# Patient Record
Sex: Female | Born: 1935 | Race: White | Hispanic: No | Smoking: Former smoker
Health system: Southern US, Community
[De-identification: ages and names within clinical notes are randomized; demographics above are authoritative.]

## PROBLEM LIST (undated history)

## (undated) DIAGNOSIS — I482 Chronic atrial fibrillation, unspecified: Secondary | ICD-10-CM

## (undated) DIAGNOSIS — I341 Nonrheumatic mitral (valve) prolapse: Secondary | ICD-10-CM

## (undated) DIAGNOSIS — I10 Essential (primary) hypertension: Secondary | ICD-10-CM

## (undated) DIAGNOSIS — E119 Type 2 diabetes mellitus without complications: Secondary | ICD-10-CM

## (undated) DIAGNOSIS — N390 Urinary tract infection, site not specified: Secondary | ICD-10-CM

## (undated) DIAGNOSIS — I5032 Chronic diastolic (congestive) heart failure: Secondary | ICD-10-CM

## (undated) DIAGNOSIS — M4800 Spinal stenosis, site unspecified: Secondary | ICD-10-CM

## (undated) DIAGNOSIS — M810 Age-related osteoporosis without current pathological fracture: Secondary | ICD-10-CM

---

## 2016-02-23 ENCOUNTER — Encounter: Payer: Medicare Other | Attending: Surgery | Admitting: Surgery

## 2016-02-23 DIAGNOSIS — E11622 Type 2 diabetes mellitus with other skin ulcer: Secondary | ICD-10-CM | POA: Insufficient documentation

## 2016-02-23 DIAGNOSIS — F0390 Unspecified dementia without behavioral disturbance: Secondary | ICD-10-CM | POA: Diagnosis not present

## 2016-02-23 DIAGNOSIS — X58XXXA Exposure to other specified factors, initial encounter: Secondary | ICD-10-CM | POA: Insufficient documentation

## 2016-02-23 DIAGNOSIS — I482 Chronic atrial fibrillation: Secondary | ICD-10-CM | POA: Diagnosis not present

## 2016-02-23 DIAGNOSIS — I10 Essential (primary) hypertension: Secondary | ICD-10-CM | POA: Insufficient documentation

## 2016-02-23 DIAGNOSIS — L97212 Non-pressure chronic ulcer of right calf with fat layer exposed: Secondary | ICD-10-CM | POA: Insufficient documentation

## 2016-02-23 DIAGNOSIS — I739 Peripheral vascular disease, unspecified: Secondary | ICD-10-CM | POA: Insufficient documentation

## 2016-02-23 DIAGNOSIS — I890 Lymphedema, not elsewhere classified: Secondary | ICD-10-CM | POA: Insufficient documentation

## 2016-02-23 DIAGNOSIS — S81811A Laceration without foreign body, right lower leg, initial encounter: Secondary | ICD-10-CM | POA: Insufficient documentation

## 2016-02-23 DIAGNOSIS — E785 Hyperlipidemia, unspecified: Secondary | ICD-10-CM | POA: Diagnosis not present

## 2016-02-23 NOTE — Progress Notes (Addendum)
Terri Terri Barker, Terri Terri Barker ([id number]) Visit Report for 02/23/2016 Chief Complaint Document Details Patient Name: Terri Barker Date of Service: 02/23/2016 9:00 AM Medical Record Number: [id number] Patient Account Number: [id number] Date of Birth/Sex: 1927/09/21 (83 y.o. Female) Treating RN: Huel Coventry Primary Care Provider: Clovis Pu, JILL Other Clinician: Referring Provider: Eloisa Northern Treating Provider/Extender: Rudene Re in Treatment: 0 Information Obtained from: Patient Chief Complaint Patient seen for complaints of Non-Healing Wound to the right lower extremity caused by multiple falls due to being unsteady. These have been there over the last month Electronic Signature(s) Signed: 02/23/2016 10:56:32 AM By: Evlyn Kanner MD, FACS Entered By: Evlyn Kanner on 02/23/2016 10:56:32 Terri Terri Barker ([id number]) -------------------------------------------------------------------------------- HPI Details Patient Name: Terri Terri Barker Date of Service: 02/23/2016 9:00 AM Medical Record Number: [id number] Patient Account Number: [id number] Date of Birth/Sex: 1927/01/13 (83 y.o. Female) Treating RN: Huel Coventry Primary Care Provider: Clovis Pu, JILL Other Clinician: Referring Provider: Eloisa Northern Treating Provider/Extender: Rudene Re in Treatment: 0 History of Present Illness Location: right lower extremity mainly on the posterior and lateral calf Quality: Patient reports experiencing a dull pain to affected area(s). Severity: Patient states wound are getting worse. Duration: Patient has had the wound for < 4 weeks prior to presenting for treatment Timing: Pain in wound is Intermittent (comes and goes Context: The wound appeared gradually over time Modifying Factors: Other treatment(s) tried include:has been on local treatment with Santyl ointment and also oral doxycycline Associated Signs and Symptoms: Patient reports having increase swelling. HPI Description: 83 year old patient who is in  to [place] by the [facility], has a past medical history of generalized weakness, chronic atrial fibrillation, type 2 diabetes mellitus without complications, hyperlipidemia, benign neoplasm of bone, essential hypertension. She has been under the care of [facility] for a wound on her left shin since the middle of January 2018. the recommendation was to use triple antibiotic ointment and Telfa daily. 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 At a later visit the patient was also found to have a wound on her right shin of about 3 weeks' duration. The wound was treated with Santyl ointment. Right lower extremity DVT study was negative. Non-invasive arterial study done showed there was hemodynamically significant arterial sclerotic changes involving the right common femoral artery, proximal right superficial femoral artery, right posterior tibial artery and the right DPA. There was also hemodynamically significant arterial sclerotic disease involving the left popliteal artery. An attempt was made to review medical records electronically but none were available on the hospital system. Her most recent INR was 3.1 and her last hemoglobin A1c was 6.5 Electronic Signature(s) Signed: 02/23/2016 10:58:05 AM By: Evlyn Kanner MD, FACS Previous Signature: 02/23/2016 9:55:44 AM Version By: Evlyn Kanner MD, FACS Previous Signature: 02/23/2016 9:39:56 AM Version By: Evlyn Kanner MD, FACS Previous Signature: 02/23/2016 9:37:07 AM Version By: Evlyn Kanner MD, FACS Previous Signature: 02/23/2016 9:36:20 AM Version By: Evlyn Kanner MD, FACS Entered By: Evlyn Kanner on 02/23/2016 10:58:04 Terri Terri Barker ([id number]) -------------------------------------------------------------------------------- Physical Exam Details Patient Name: Terri Terri Barker Date of Service: 02/23/2016 9:00 AM Medical Record Number:  [id number] Patient Account Number: [id number] Date of Birth/Sex: Sep 26, 1927 (83 y.o. Female) Treating RN: Huel Coventry Primary Care Provider: Clovis Pu, JILL Other Clinician: Referring Provider: Eloisa Northern Treating Provider/Extender: Rudene Re in Treatment: 0 Constitutional . Pulse regular. Respirations normal and unlabored. Afebrile. . Eyes Nonicteric. Reactive to light. Ears, Nose, Mouth, and  Throat Lips, teeth, and gums WNL.Marland Kitchen Moist mucosa without lesions. Neck supple and nontender. No palpable supraclavicular or cervical adenopathy. Normal sized without goiter. Respiratory WNL. No retractions.. Cardiovascular Pedal Pulses WNL. recent arterial duplex study evaluation done and reviewed. she has got lymphedema both lower extremity stage I. Gastrointestinal (GI) Abdomen without masses or tenderness.. No liver or spleen enlargement or tenderness.. Lymphatic No adneopathy. No adenopathy. No adenopathy. Musculoskeletal Adexa without tenderness or enlargement.. Digits and nails w/o clubbing, cyanosis, infection, petechiae, ischemia, or inflammatory conditions.. Integumentary (Hair, Skin) No suspicious lesions. No crepitus or fluctuance. No peri-wound warmth or erythema. No masses.Marland Kitchen Psychiatric Judgement and insight Intact.. No evidence of depression, anxiety, or agitation.. Notes the patient has healed scars on the left lower extremity with multiple bruises bilaterally and being on Coumadin her skin has ecchymosis all over. She has several lacerated areas on the right lower extremity some of them covered with slough and due to high INR no debridement has been done today Electronic Signature(s) Signed: 02/23/2016 10:59:21 AM By: Evlyn Kanner MD, FACS Entered By: Evlyn Kanner on 02/23/2016 10:59:20 Terri Terri Barker ([id number]) -------------------------------------------------------------------------------- Physician Orders Details Patient Name: Terri Terri Barker Date of Service:  02/23/2016 9:00 AM Medical Record Number: [id number] Patient Account Number: [id number] Date of Birth/Sex: 1927/01/16 (83 y.o. Female) Treating RN: Huel Coventry Primary Care Provider: Clovis Pu, JILL Other Clinician: Referring Provider: Eloisa Northern Treating Provider/Extender: Rudene Re in Treatment: 0 Verbal / Phone Orders: No Diagnosis Coding Wound Cleansing Wound #1 Right,Anterior Lower Leg o Clean wound with Normal Saline. Wound #2 Right,Lateral Lower Leg o Clean wound with Normal Saline. Wound #3 Right,Medial Lower Leg o Clean wound with Normal Saline. Wound #4 Right,Posterior Lower Leg o Clean wound with Normal Saline. Wound #5 Left Lower Leg o Clean wound with Normal Saline. Wound #6 Right Lower Leg o Clean wound with Normal Saline. Anesthetic Wound #1 Right,Anterior Lower Leg o Topical Lidocaine 4% cream applied to wound bed prior to debridement Wound #2 Right,Lateral Lower Leg o Topical Lidocaine 4% cream applied to wound bed prior to debridement Wound #3 Right,Medial Lower Leg o Topical Lidocaine 4% cream applied to wound bed prior to debridement Wound #4 Right,Posterior Lower Leg o Topical Lidocaine 4% cream applied to wound bed prior to debridement Wound #5 Left Lower Leg o Topical Lidocaine 4% cream applied to wound bed prior to debridement Wound #6 Right Lower Leg o Topical Lidocaine 4% cream applied to wound bed prior to debridement Terri Terri Barker, Terri Terri Barker ([id number]) Primary Wound Dressing Wound #1 Right,Anterior Lower Leg o Santyl Ointment - Medihoney or Manuka Honey (over the counter) Wound #2 Right,Lateral Lower Leg o Santyl Ointment - Medihoney or Manuka Honey (over the counter) Wound #3 Right,Medial Lower Leg o Santyl Ointment - Medihoney or Manuka Honey (over the counter) Wound #4 Right,Posterior Lower Leg o Santyl Ointment - Medihoney or Manuka Honey (over the counter) Wound #5 Left Lower Leg o Santyl Ointment - Medihoney  or Manuka Honey (over the counter) Wound #6 Right Lower Leg o Santyl Ointment - Medihoney or Manuka Honey (over the counter) Secondary Dressing Wound #1 Right,Anterior Lower Leg o ABD and Kerlix/Conform Wound #2 Right,Lateral Lower Leg o ABD and Kerlix/Conform Wound #3 Right,Medial Lower Leg o ABD and Kerlix/Conform Wound #4 Right,Posterior Lower Leg o ABD and Kerlix/Conform Wound #5 Left Lower Leg o ABD and Kerlix/Conform Wound #6 Right Lower Leg o ABD and Kerlix/Conform Dressing Change Frequency Wound #1 Right,Anterior Lower Leg o Change dressing every day. Wound #2 Right,Lateral Lower Leg o  Change dressing every day. Wound #3 Right,Medial Lower Leg o Change dressing every day. Terri Barker, Terri Terri Barker ([id number]) Wound #4 Right,Posterior Lower Leg o Change dressing every day. Wound #5 Left Lower Leg o Change dressing every day. Wound #6 Right Lower Leg o Change dressing every day. Follow-up Appointments Wound #1 Right,Anterior Lower Leg o Return Appointment in 1 week. Wound #2 Right,Lateral Lower Leg o Return Appointment in 1 week. Wound #3 Right,Medial Lower Leg o Return Appointment in 1 week. Wound #4 Right,Posterior Lower Leg o Return Appointment in 1 week. Wound #5 Left Lower Leg o Return Appointment in 1 week. Wound #6 Right Lower Leg o Return Appointment in 1 week. Off-Loading Wound #1 Right,Anterior Lower Leg o Other: - Elevate legs as much as possible. Wound #2 Right,Lateral Lower Leg o Other: - Elevate legs as much as possible. Wound #3 Right,Medial Lower Leg o Other: - Elevate legs as much as possible. Wound #4 Right,Posterior Lower Leg o Other: - Elevate legs as much as possible. Wound #5 Left Lower Leg o Other: - Elevate legs as much as possible. Wound #6 Right Lower Leg o Other: - Elevate legs as much as possible. Terri Barker, Terri Terri Barker ([id number]) Additional Orders / Instructions Wound #1 Right,Anterior Lower  Leg o Increase protein intake. o Other: - Vitamin A, C and Zinc Wound #2 Right,Lateral Lower Leg o Increase protein intake. o Other: - Vitamin A, C and Zinc Wound #3 Right,Medial Lower Leg o Increase protein intake. o Other: - Vitamin A, C and Zinc Wound #4 Right,Posterior Lower Leg o Increase protein intake. o Other: - Vitamin A, C and Zinc Wound #5 Left Lower Leg o Increase protein intake. o Other: - Vitamin A, C and Zinc Wound #6 Right Lower Leg o Increase protein intake. o Other: - Vitamin A, C and Zinc Home Health Wound #1 Right,Anterior Lower Leg o Continue Home Health Visits o Home Health Nurse may visit PRN to address patientos wound care needs. o FACE TO FACE ENCOUNTER: MEDICARE and MEDICAID PATIENTS: I certify that this patient is under my care and that I had a face-to-face encounter that meets the physician face-to-face encounter requirements with this patient on this date. The encounter with the patient was in whole or in part for the following MEDICAL CONDITION: (primary reason for Home Healthcare) MEDICAL NECESSITY: I certify, that based on my findings, NURSING services are a medically necessary home health service. HOME BOUND STATUS: I certify that my clinical findings support that this patient is homebound (i.e., Due to illness or injury, pt requires aid of supportive devices such as crutches, cane, wheelchairs, walkers, the use of special transportation or the assistance of another person to leave their place of residence. There is a normal inability to leave the home and doing so requires considerable and taxing effort. Other absences are for medical reasons / religious services and are infrequent or of short duration when for other reasons). o If current dressing causes regression in wound condition, may D/C ordered dressing product/s and apply Normal Saline Moist Dressing daily until next Wound Healing Center / Other  MD appointment. Notify Wound Healing Center of regression in wound condition at [phone]. o Please direct any NON-WOUND related issues/requests for orders to patient's Primary Care Physician Wound #2 Right,Lateral Lower Leg o Continue Home Health Visits Terri Terri Barker, Terri Barker ([id number]) o Home Health Nurse may visit PRN to address patientos wound care needs. o FACE TO FACE ENCOUNTER: MEDICARE and MEDICAID PATIENTS: I certify that this patient is under my care and  that I had a face-to-face encounter that meets the physician face-to-face encounter requirements with this patient on this date. The encounter with the patient was in whole or in part for the following MEDICAL CONDITION: (primary reason for Home Healthcare) MEDICAL NECESSITY: I certify, that based on my findings, NURSING services are a medically necessary home health service. HOME BOUND STATUS: I certify that my clinical findings support that this patient is homebound (i.e., Due to illness or injury, pt requires aid of supportive devices such as crutches, cane, wheelchairs, walkers, the use of special transportation or the assistance of another person to leave their place of residence. There is a normal inability to leave the home and doing so requires considerable and taxing effort. Other absences are for medical reasons / religious services and are infrequent or of short duration when for other reasons). o If current dressing causes regression in wound condition, may D/C ordered dressing product/s and apply Normal Saline Moist Dressing daily until next Wound Healing Center / Other MD appointment. Notify Wound Healing Center of regression in wound condition at [phone]. o Please direct any NON-WOUND related issues/requests for orders to patient's Primary Care Physician Wound #3 Right,Medial Lower Leg o Continue Home Health Visits o Home Health Nurse may visit PRN to address patientos wound care needs. o FACE TO  FACE ENCOUNTER: MEDICARE and MEDICAID PATIENTS: I certify that this patient is under my care and that I had a face-to-face encounter that meets the physician face-to-face encounter requirements with this patient on this date. The encounter with the patient was in whole or in part for the following MEDICAL CONDITION: (primary reason for Home Healthcare) MEDICAL NECESSITY: I certify, that based on my findings, NURSING services are a medically necessary home health service. HOME BOUND STATUS: I certify that my clinical findings support that this patient is homebound (i.e., Due to illness or injury, pt requires aid of supportive devices such as crutches, cane, wheelchairs, walkers, the use of special transportation or the assistance of another person to leave their place of residence. There is a normal inability to leave the home and doing so requires considerable and taxing effort. Other absences are for medical reasons / religious services and are infrequent or of short duration when for other reasons). o If current dressing causes regression in wound condition, may D/C ordered dressing product/s and apply Normal Saline Moist Dressing daily until next Wound Healing Center / Other MD appointment. Notify Wound Healing Center of regression in wound condition at [phone]. o Please direct any NON-WOUND related issues/requests for orders to patient's Primary Care Physician Wound #4 Right,Posterior Lower Leg o Continue Home Health Visits o Home Health Nurse may visit PRN to address patientos wound care needs. o FACE TO FACE ENCOUNTER: MEDICARE and MEDICAID PATIENTS: I certify that this patient is under my care and that I had a face-to-face encounter that meets the physician face-to-face encounter requirements with this patient on this date. The encounter with the patient was in whole or in part for the following MEDICAL CONDITION: (primary reason for Home Healthcare) MEDICAL NECESSITY: I  certify, that based on my findings, NURSING services are a medically necessary home health service. HOME BOUND STATUS: I certify that my clinical findings support that this patient is homebound (i.e., Due to illness or injury, pt requires aid of Terri Barker, Terri Barker ([id number]) supportive devices such as crutches, cane, wheelchairs, walkers, the use of special transportation or the assistance of another person to leave their place of residence. There is a  normal inability to leave the home and doing so requires considerable and taxing effort. Other absences are for medical reasons / religious services and are infrequent or of short duration when for other reasons). o If current dressing causes regression in wound condition, may D/C ordered dressing product/s and apply Normal Saline Moist Dressing daily until next Wound Healing Center / Other MD appointment. Notify Wound Healing Center of regression in wound condition at [phone]. o Please direct any NON-WOUND related issues/requests for orders to patient's Primary Care Physician Wound #5 Left Lower Leg o Continue Home Health Visits o Home Health Nurse may visit PRN to address patientos wound care needs. o FACE TO FACE ENCOUNTER: MEDICARE and MEDICAID PATIENTS: I certify that this patient is under my care and that I had a face-to-face encounter that meets the physician face-to-face encounter requirements with this patient on this date. The encounter with the patient was in whole or in part for the following MEDICAL CONDITION: (primary reason for Home Healthcare) MEDICAL NECESSITY: I certify, that based on my findings, NURSING services are a medically necessary home health service. HOME BOUND STATUS: I certify that my clinical findings support that this patient is homebound (i.e., Due to illness or injury, pt requires aid of supportive devices such as crutches, cane, wheelchairs, walkers, the use of special transportation or the  assistance of another person to leave their place of residence. There is a normal inability to leave the home and doing so requires considerable and taxing effort. Other absences are for medical reasons / religious services and are infrequent or of short duration when for other reasons). o If current dressing causes regression in wound condition, may D/C ordered dressing product/s and apply Normal Saline Moist Dressing daily until next Wound Healing Center / Other MD appointment. Notify Wound Healing Center of regression in wound condition at [phone]. o Please direct any NON-WOUND related issues/requests for orders to patient's Primary Care Physician Wound #6 Right Lower Leg o Continue Home Health Visits o Home Health Nurse may visit PRN to address patientos wound care needs. o FACE TO FACE ENCOUNTER: MEDICARE and MEDICAID PATIENTS: I certify that this patient is under my care and that I had a face-to-face encounter that meets the physician face-to-face encounter requirements with this patient on this date. The encounter with the patient was in whole or in part for the following MEDICAL CONDITION: (primary reason for Home Healthcare) MEDICAL NECESSITY: I certify, that based on my findings, NURSING services are a medically necessary home health service. HOME BOUND STATUS: I certify that my clinical findings support that this patient is homebound (i.e., Due to illness or injury, pt requires aid of supportive devices such as crutches, cane, wheelchairs, walkers, the use of special transportation or the assistance of another person to leave their place of residence. There is a normal inability to leave the home and doing so requires considerable and taxing effort. Other absences are for medical reasons / religious services and are infrequent or of short duration when for other reasons). o If current dressing causes regression in wound condition, may D/C ordered dressing  product/s and apply Normal Saline Moist Dressing daily until next Wound Healing Center / Other MD appointment. Notify Wound Healing Center of regression in wound condition at [phone]. Terri Barker, Terri Terri Barker ([id number]) o Please direct any NON-WOUND related issues/requests for orders to patient's Primary Care Physician Medications-please add to medication list. Wound #1 Right,Anterior Lower Leg o Santyl Enzymatic Ointment - apply nickel thick to patient's wounds Wound #  2 Right,Lateral Lower Leg o Santyl Enzymatic Ointment - apply nickel thick to patient's wounds Wound #3 Right,Medial Lower Leg o Santyl Enzymatic Ointment - apply nickel thick to patient's wounds Wound #4 Right,Posterior Lower Leg o Santyl Enzymatic Ointment - apply nickel thick to patient's wounds Wound #5 Left Lower Leg o Santyl Enzymatic Ointment - apply nickel thick to patient's wounds Wound #6 Right Lower Leg o Santyl Enzymatic Ointment - apply nickel thick to patient's wounds Consults o Vascular - Arterial Studies and Cardiac Consult Patient Medications Allergies: codeine, Feldene, Sulfa (Sulfonamide Antibiotics) Notifications Medication Indication Start End Santyl 02/23/2016 DOSE topical 250 unit/gram ointment - ointment topical as directed Electronic Signature(s) Signed: 02/23/2016 11:00:47 AM By: Evlyn KannerBritto, Dorethy Tomey MD, FACS Entered By: Evlyn KannerBritto, Lenin Kuhnle on 02/23/2016 11:00:43 Terri RouteBARKER, Lyle ([id number]) -------------------------------------------------------------------------------- Problem List Details Patient Name: Terri RouteBARKER, Tauna Date of Service: 02/23/2016 9:00 AM Medical Record Number: [id number] Patient Account Number: [id number] Date of Birth/Sex: 27-Nov-1927 (83 y.o. Female) Treating RN: Huel CoventryWoody, Kim Primary Care Provider: Clovis PuVAN HORN, JILL Other Clinician: Referring Provider: Eloisa NorthernAMIN, SAAD Treating Provider/Extender: Rudene ReBritto, Dayanna Pryce Weeks in Treatment: 0 Active Problems ICD-10 Encounter Code Description Active  Date Diagnosis E11.622 Type 2 diabetes mellitus with other skin ulcer 02/23/2016 Yes S81.811A Laceration without foreign body, right lower leg, initial 02/23/2016 Yes encounter L97.212 Non-pressure chronic ulcer of right calf with fat layer 02/23/2016 Yes exposed I89.0 Lymphedema, not elsewhere classified 02/23/2016 Yes I73.9 Peripheral vascular disease, unspecified 02/23/2016 Yes Inactive Problems Resolved Problems Electronic Signature(s) Signed: 02/23/2016 11:02:22 AM By: Evlyn KannerBritto, Eilis Chestnutt MD, FACS Previous Signature: 02/23/2016 10:55:42 AM Version By: Evlyn KannerBritto, Karim Aiello MD, FACS Entered By: Evlyn KannerBritto, Mckade Gurka on 02/23/2016 11:02:22 Terri RouteBARKER, Lillianah ([id number]) -------------------------------------------------------------------------------- Progress Note Details Patient Name: Terri RouteBARKER, Dannah Date of Service: 02/23/2016 9:00 AM Medical Record Number: [id number] Patient Account Number: [id number] Date of Birth/Sex: 27-Nov-1927 (83 y.o. Female) Treating RN: Huel CoventryWoody, Kim Primary Care Provider: Clovis PuVAN HORN, JILL Other Clinician: Referring Provider: Eloisa NorthernAMIN, SAAD Treating Provider/Extender: Rudene ReBritto, Alyona Romack Weeks in Treatment: 0 Subjective Chief Complaint Information obtained from Patient Patient seen for complaints of Non-Healing Wound to the right lower extremity caused by multiple falls due to being unsteady. These have been there over the last month History of Present Illness (HPI) The following HPI elements were documented for the patient's wound: Location: right lower extremity mainly on the posterior and lateral calf Quality: Patient reports experiencing a dull pain to affected area(s). Severity: Patient states wound are getting worse. Duration: Patient has had the wound for < 4 weeks prior to presenting for treatment Timing: Pain in wound is Intermittent (comes and goes Context: The wound appeared gradually over time Modifying Factors: Other treatment(s) tried include:has been on local treatment with Santyl  ointment and also oral doxycycline Associated Signs and Symptoms: Patient reports having increase swelling. 83 year old [facility], has a past medical history of generalized weakness, chronic atrial fibrillation, type 2 diabetes mellitus without complications, hyperlipidemia, benign neoplasm of bone, essential hypertension. She has been under the care of [facility] group for a wound on her left shin since the middle of January 2018. the recommendation was to use triple antibiotic ointment and Telfa daily. 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 At a later visit the patient was also found to have a wound on her right shin of about 3 weeks' duration. The wound was treated with Santyl ointment. Right lower extremity DVT study was  negative. Non-invasive arterial study done showed there was hemodynamically significant arterial sclerotic changes involving the right common femoral artery, proximal right superficial femoral artery, right posterior tibial artery and the right DPA. There was also hemodynamically significant arterial sclerotic disease involving the left popliteal artery. An attempt was made to review medical records electronically but none were available on the hospital system. Her most recent INR was 3.1 and her last hemoglobin A1c was 6.5 Wound History Patient presents with 7 open wounds that have been present for approximately 5 weeks. Patient has been treating wounds in the following manner: abx,. Laboratory tests have been performed in the last month. Terri Terri Barker, Terri Barker ([id number]) Patient reportedly has tested positive for an antibiotic resistant organism. Patient reportedly has not tested positive for osteomyelitis. Patient reportedly has not had testing performed to evaluate circulation in the legs. Patient History Information obtained from  Patient. Allergies codeine, Feldene, Sulfa (Sulfonamide Antibiotics) Social History Former smoker - 30 + years, Marital Status - Widowed, Alcohol Use - Rarely, Drug Use - No History, Caffeine Use - Daily. Medical History Eyes Patient has history of Cataracts - removed Denies history of Glaucoma, Optic Neuritis Ear/Nose/Mouth/Throat Denies history of Chronic sinus problems/congestion, Middle ear problems Hematologic/Lymphatic Denies history of Anemia, Hemophilia, Human Immunodeficiency Virus, Lymphedema, Sickle Cell Disease Respiratory Denies history of Aspiration, Asthma, Chronic Obstructive Pulmonary Disease (COPD), Pneumothorax, Sleep Apnea, Tuberculosis Cardiovascular Patient has history of Arrhythmia, Hypertension Denies history of Angina, Congestive Heart Failure, Coronary Artery Disease, Deep Vein Thrombosis, Hypotension, Myocardial Infarction, Peripheral Arterial Disease, Peripheral Venous Disease, Phlebitis, Vasculitis Gastrointestinal Denies history of Cirrhosis , Colitis, Crohn s, Hepatitis A, Hepatitis B, Hepatitis C Endocrine Patient has history of Type II Diabetes Denies history of Type I Diabetes Genitourinary Denies history of End Stage Renal Disease Immunological Denies history of Lupus Erythematosus, Raynaud s, Scleroderma Integumentary (Skin) Denies history of History of Burn, History of pressure wounds Musculoskeletal Patient has history of Osteoarthritis - Back Denies history of Gout, Rheumatoid Arthritis, Osteomyelitis Neurologic Patient has history of Dementia Denies history of Neuropathy, Quadriplegia, Paraplegia, Seizure Disorder Oncologic Denies history of Received Chemotherapy, Received Radiation Psychiatric SHANTI, Terri Terri Barker ([id number]) Denies history of Anorexia/bulimia, Confinement Anxiety Patient is treated with Oral Agents. Blood sugar is tested. Hospitalization/Surgery History - 01/09/2016, [facility], [place], fall. Medical And Surgical  History Notes Eyes Macular degeneration Review of Systems (ROS) Constitutional Symptoms (General Health) Complains or has symptoms of Fatigue. Denies complaints or symptoms of Fever, Chills, Marked Weight Change. Eyes Complains or has symptoms of Vision Changes. Denies complaints or symptoms of Dry Eyes, Glasses / Contacts. Ear/Nose/Mouth/Throat The patient has no complaints or symptoms. Hematologic/Lymphatic The patient has no complaints or symptoms. Respiratory The patient has no complaints or symptoms. Cardiovascular Complains or has symptoms of LE edema. Denies complaints or symptoms of Chest pain. Gastrointestinal The patient has no complaints or symptoms. Endocrine Denies complaints or symptoms of Hepatitis, Thyroid disease, Polydypsia (Excessive Thirst). Genitourinary Complains or has symptoms of Incontinence/dribbling. Denies complaints or symptoms of Kidney failure/ Dialysis. Immunological The patient has no complaints or symptoms. Integumentary (Skin) Complains or has symptoms of Wounds, Bleeding or bruising tendency, Swelling. Denies complaints or symptoms of Breakdown. Musculoskeletal Complains or has symptoms of Muscle Pain. Neurologic Complains or has symptoms of Numbness/parasthesias - Left hand. Oncologic The patient has no complaints or symptoms. Psychiatric Complains or has symptoms of Anxiety. Terri Terri Barker, Terri Terri Barker ([id number]) Objective Constitutional Pulse regular. Respirations normal and unlabored. Afebrile. Vitals Time Taken: 9:26 AM, Height: 63 in, Weight: 122 lbs,  Source: Measured, BMI: 21.6, Temperature: 97.9 F, Pulse: 71 bpm, Respiratory Rate: 16 breaths/min, Blood Pressure: 129/55 mmHg. Eyes Nonicteric. Reactive to light. Ears, Nose, Mouth, and Throat Lips, teeth, and gums WNL.Marland Kitchen Moist mucosa without lesions. Neck supple and nontender. No palpable supraclavicular or cervical adenopathy. Normal sized without goiter. Respiratory WNL. No  retractions.. Cardiovascular Pedal Pulses WNL. recent arterial duplex study evaluation done and reviewed. she has got lymphedema both lower extremity stage I. Gastrointestinal (GI) Abdomen without masses or tenderness.. No liver or spleen enlargement or tenderness.. Lymphatic No adneopathy. No adenopathy. No adenopathy. Musculoskeletal Adexa without tenderness or enlargement.. Digits and nails w/o clubbing, cyanosis, infection, petechiae, ischemia, or inflammatory conditions.Marland Kitchen Psychiatric Judgement and insight Intact.. No evidence of depression, anxiety, or agitation.. General Notes: the patient has healed scars on the left lower extremity with multiple bruises bilaterally and being on Coumadin her skin has ecchymosis all over. She has several lacerated areas on the right lower extremity some of them covered with slough and due to high INR no debridement has been done today Integumentary (Hair, Skin) No suspicious lesions. No crepitus or fluctuance. No peri-wound warmth or erythema. No masses.. Wound #1 status is Open. Original cause of wound was Gradually Appeared. The wound is located on the Right,Anterior Lower Leg. The wound measures 2cm length x 1cm width x 0.2cm depth; 1.571cm^2 area and 0.314cm^3 volume. There is Fat Layer (Subcutaneous Tissue) Exposed exposed. There is a large Charlotte, KAROLINE ([id number]) amount of serous drainage noted. The wound margin is flat and intact. There is no granulation within the wound bed. There is a large (67-100%) amount of necrotic tissue within the wound bed including Eschar. The periwound skin appearance exhibited: Maceration. Wound #2 status is Open. Original cause of wound was Gradually Appeared. The wound is located on the Right,Lateral Lower Leg. The wound measures 1cm length x 1.2cm width x 0.1cm depth; 0.942cm^2 area and 0.094cm^3 volume. There is Fat Layer (Subcutaneous Tissue) Exposed exposed. There is no tunneling or undermining noted. The  wound margin is flat and intact. There is small (1-33%) granulation within the wound bed. There is a large (67-100%) amount of necrotic tissue within the wound bed including Adherent Slough. The periwound skin appearance did not exhibit: Callus, Crepitus, Excoriation, Induration, Rash, Scarring, Dry/Scaly, Maceration, Atrophie Blanche, Cyanosis, Ecchymosis, Hemosiderin Staining, Mottled, Pallor, Rubor, Erythema. Wound #3 status is Open. Original cause of wound was Gradually Appeared. The wound is located on the Right,Medial Lower Leg. The wound measures 1.2cm length x 1cm width x 0.3cm depth; 0.942cm^2 area and 0.283cm^3 volume. There is Fat Layer (Subcutaneous Tissue) Exposed exposed. There is no tunneling or undermining noted. There is a large amount of serous drainage noted. The wound margin is flat and intact. There is no granulation within the wound bed. There is a large (67-100%) amount of necrotic tissue within the wound bed. The periwound skin appearance exhibited: Excoriation. The periwound skin appearance did not exhibit: Callus, Crepitus, Induration, Rash, Scarring, Dry/Scaly, Maceration, Atrophie Blanche, Cyanosis, Ecchymosis, Hemosiderin Staining, Mottled, Pallor, Rubor, Erythema. Wound #4 status is Open. Original cause of wound was Trauma. The wound is located on the Right,Posterior Lower Leg. The wound measures 2.2cm length x 1.5cm width x 0.4cm depth; 2.592cm^2 area and 1.037cm^3 volume. There is Fat Layer (Subcutaneous Tissue) Exposed exposed. There is a large amount of serous drainage noted. The wound margin is flat and intact. Wound #5 status is Open. Original cause of wound was Trauma. The wound is located on the Left Lower Leg.  The wound measures 1.5cm length x 1.8cm width x 0.1cm depth; 2.121cm^2 area and 0.212cm^3 volume. The wound is limited to skin breakdown. There is no tunneling or undermining noted. There is a large amount of drainage noted. The wound margin is flat  and intact. There is no granulation within the wound bed. There is a large (67-100%) amount of necrotic tissue within the wound bed including Eschar. Wound #6 status is Open. Original cause of wound was Trauma. The wound is located on the Right Lower Leg. The wound measures 1cm length x 2.5cm width x 0.1cm depth; 1.963cm^2 area and 0.196cm^3 volume. The wound is limited to skin breakdown. There is no tunneling or undermining noted. There is a large amount of serous drainage noted. The wound margin is flat and intact. There is no granulation within the wound bed. There is a large (67-100%) amount of necrotic tissue within the wound bed including Eschar. The periwound skin appearance did not exhibit: Callus, Crepitus, Excoriation, Induration, Rash, Scarring, Dry/Scaly, Maceration, Atrophie Blanche, Cyanosis, Ecchymosis, Hemosiderin Staining, Mottled, Pallor, Rubor, Erythema. Assessment Active Problems ICD-10 Terri Terri Barker, Terri Terri Barker ([id number]) E11.622 - Type 2 diabetes mellitus with other skin ulcer S81.811A - Laceration without foreign body, right lower leg, initial encounter L97.212 - Non-pressure chronic ulcer of right calf with fat layer exposed I89.0 - Lymphedema, not elsewhere classified I73.9 - Peripheral vascular disease, unspecified 83 year old patient who has multiple medical comorbidities including diabetes mellitus, defibrillation and possibly CHF has recently come in from [place] and has been having multiple falls due to unsteady gait. After review I have recommended: 1. To establish with a PCP regarding her general health including diabetes mellitus. 2. To see Dr. Nanetta Batty regarding her arterial disease peripherally and also her cardiology workup. 3. Santyl ointment locally to be applied to the wounds with a light Kerlix dressing. 4. elevation and exercise has been discussed in great detail 5. Her DVT study was negative but at some stage she will need a venous reflux study 6.  Adequate protein, vitamin A, vitamin C and zinc. Her granddaughter was at the bedside has had all questions answered. Plan Wound Cleansing: Wound #1 Right,Anterior Lower Leg: Clean wound with Normal Saline. Wound #2 Right,Lateral Lower Leg: Clean wound with Normal Saline. Wound #3 Right,Medial Lower Leg: Clean wound with Normal Saline. Wound #4 Right,Posterior Lower Leg: Clean wound with Normal Saline. Wound #5 Left Lower Leg: Clean wound with Normal Saline. Wound #6 Right Lower Leg: Clean wound with Normal Saline. Anesthetic: Wound #1 Right,Anterior Lower Leg: Topical Lidocaine 4% cream applied to wound bed prior to debridement Wound #2 Right,Lateral Lower Leg: Topical Lidocaine 4% cream applied to wound bed prior to debridement Wound #3 Right,Medial Lower Leg: Topical Lidocaine 4% cream applied to wound bed prior to debridement Wound #4 Right,Posterior Lower Leg: Topical Lidocaine 4% cream applied to wound bed prior to debridement Wound #5 Left Lower Leg: BOBBETTE, EAKES ([id number]) Topical Lidocaine 4% cream applied to wound bed prior to debridement Wound #6 Right Lower Leg: Topical Lidocaine 4% cream applied to wound bed prior to debridement Primary Wound Dressing: Wound #1 Right,Anterior Lower Leg: Santyl Ointment - Medihoney or Manuka Honey (over the counter) Wound #2 Right,Lateral Lower Leg: Santyl Ointment - Medihoney or Manuka Honey (over the counter) Wound #3 Right,Medial Lower Leg: Santyl Ointment - Medihoney or Manuka Honey (over the counter) Wound #4 Right,Posterior Lower Leg: Santyl Ointment - Medihoney or Manuka Honey (over the counter) Wound #5 Left Lower Leg: Santyl Ointment - Medihoney or Manuka Honey (  over the counter) Wound #6 Right Lower Leg: Santyl Ointment - Medihoney or Manuka Honey (over the counter) Secondary Dressing: Wound #1 Right,Anterior Lower Leg: ABD and Kerlix/Conform Wound #2 Right,Lateral Lower Leg: ABD and Kerlix/Conform Wound #3  Right,Medial Lower Leg: ABD and Kerlix/Conform Wound #4 Right,Posterior Lower Leg: ABD and Kerlix/Conform Wound #5 Left Lower Leg: ABD and Kerlix/Conform Wound #6 Right Lower Leg: ABD and Kerlix/Conform Dressing Change Frequency: Wound #1 Right,Anterior Lower Leg: Change dressing every day. Wound #2 Right,Lateral Lower Leg: Change dressing every day. Wound #3 Right,Medial Lower Leg: Change dressing every day. Wound #4 Right,Posterior Lower Leg: Change dressing every day. Wound #5 Left Lower Leg: Change dressing every day. Wound #6 Right Lower Leg: Change dressing every day. Follow-up Appointments: Wound #1 Right,Anterior Lower Leg: Return Appointment in 1 week. Wound #2 Right,Lateral Lower Leg: Return Appointment in 1 week. Wound #3 Right,Medial Lower Leg: Return Appointment in 1 week. Wound #4 Right,Posterior Lower Leg: Return Appointment in 1 week. ZOHAR, LAING ([id number]) Wound #5 Left Lower Leg: Return Appointment in 1 week. Wound #6 Right Lower Leg: Return Appointment in 1 week. Off-Loading: Wound #1 Right,Anterior Lower Leg: Other: - Elevate legs as much as possible. Wound #2 Right,Lateral Lower Leg: Other: - Elevate legs as much as possible. Wound #3 Right,Medial Lower Leg: Other: - Elevate legs as much as possible. Wound #4 Right,Posterior Lower Leg: Other: - Elevate legs as much as possible. Wound #5 Left Lower Leg: Other: - Elevate legs as much as possible. Wound #6 Right Lower Leg: Other: - Elevate legs as much as possible. Additional Orders / Instructions: Wound #1 Right,Anterior Lower Leg: Increase protein intake. Other: - Vitamin A, C and Zinc Wound #2 Right,Lateral Lower Leg: Increase protein intake. Other: - Vitamin A, C and Zinc Wound #3 Right,Medial Lower Leg: Increase protein intake. Other: - Vitamin A, C and Zinc Wound #4 Right,Posterior Lower Leg: Increase protein intake. Other: - Vitamin A, C and Zinc Wound #5 Left Lower  Leg: Increase protein intake. Other: - Vitamin A, C and Zinc Wound #6 Right Lower Leg: Increase protein intake. Other: - Vitamin A, C and Zinc Home Health: Wound #1 Right,Anterior Lower Leg: Continue Home Health Visits Home Health Nurse may visit PRN to address patient s wound care needs. FACE TO FACE ENCOUNTER: MEDICARE and MEDICAID PATIENTS: I certify that this patient is under my care and that I had a face-to-face encounter that meets the physician face-to-face encounter requirements with this patient on this date. The encounter with the patient was in whole or in part for the following MEDICAL CONDITION: (primary reason for Home Healthcare) MEDICAL NECESSITY: I certify, that based on my findings, NURSING services are a medically necessary home health service. HOME BOUND STATUS: I certify that my clinical findings support that this patient is homebound (i.e., Due to illness or injury, pt requires aid of supportive devices such as crutches, cane, wheelchairs, walkers, the use of special transportation or the assistance of another person to leave their place of residence. There is a normal inability to leave the home and doing so requires considerable and taxing effort. Other absences are for medical reasons / religious services and are infrequent or of short duration when for other reasons). If current dressing causes regression in wound condition, may D/C ordered dressing product/s and apply BREANAH, Terri Terri Barker ([id number]) Normal Saline Moist Dressing daily until next Wound Healing Center / Other MD appointment. Notify Wound Healing Center of regression in wound condition at [phone]. Please direct any NON-WOUND related  issues/requests for orders to patient's Primary Care Physician Wound #2 Right,Lateral Lower Leg: Continue Home Health Visits Home Health Nurse may visit PRN to address patient s wound care needs. FACE TO FACE ENCOUNTER: MEDICARE and MEDICAID PATIENTS: I certify that this  patient is under my care and that I had a face-to-face encounter that meets the physician face-to-face encounter requirements with this patient on this date. The encounter with the patient was in whole or in part for the following MEDICAL CONDITION: (primary reason for Home Healthcare) MEDICAL NECESSITY: I certify, that based on my findings, NURSING services are a medically necessary home health service. HOME BOUND STATUS: I certify that my clinical findings support that this patient is homebound (i.e., Due to illness or injury, pt requires aid of supportive devices such as crutches, cane, wheelchairs, walkers, the use of special transportation or the assistance of another person to leave their place of residence. There is a normal inability to leave the home and doing so requires considerable and taxing effort. Other absences are for medical reasons / religious services and are infrequent or of short duration when for other reasons). If current dressing causes regression in wound condition, may D/C ordered dressing product/s and apply Normal Saline Moist Dressing daily until next Wound Healing Center / Other MD appointment. Notify Wound Healing Center of regression in wound condition at [phone]. Please direct any NON-WOUND related issues/requests for orders to patient's Primary Care Physician Wound #3 Right,Medial Lower Leg: Continue Home Health Visits Home Health Nurse may visit PRN to address patient s wound care needs. FACE TO FACE ENCOUNTER: MEDICARE and MEDICAID PATIENTS: I certify that this patient is under my care and that I had a face-to-face encounter that meets the physician face-to-face encounter requirements with this patient on this date. The encounter with the patient was in whole or in part for the following MEDICAL CONDITION: (primary reason for Home Healthcare) MEDICAL NECESSITY: I certify, that based on my findings, NURSING services are a medically necessary home health  service. HOME BOUND STATUS: I certify that my clinical findings support that this patient is homebound (i.e., Due to illness or injury, pt requires aid of supportive devices such as crutches, cane, wheelchairs, walkers, the use of special transportation or the assistance of another person to leave their place of residence. There is a normal inability to leave the home and doing so requires considerable and taxing effort. Other absences are for medical reasons / religious services and are infrequent or of short duration when for other reasons). If current dressing causes regression in wound condition, may D/C ordered dressing product/s and apply Normal Saline Moist Dressing daily until next Wound Healing Center / Other MD appointment. Notify Wound Healing Center of regression in wound condition at [phone]. Please direct any NON-WOUND related issues/requests for orders to patient's Primary Care Physician Wound #4 Right,Posterior Lower Leg: Continue Home Health Visits Home Health Nurse may visit PRN to address patient s wound care needs. FACE TO FACE ENCOUNTER: MEDICARE and MEDICAID PATIENTS: I certify that this patient is under my care and that I had a face-to-face encounter that meets the physician face-to-face encounter requirements with this patient on this date. The encounter with the patient was in whole or in part for the following MEDICAL CONDITION: (primary reason for Home Healthcare) MEDICAL NECESSITY: I certify, that based on my findings, NURSING services are a medically necessary home health service. HOME BOUND STATUS: I certify that my clinical findings support that this patient is homebound (i.e.,  Due to illness or injury, pt requires aid of supportive devices such as crutches, cane, wheelchairs, walkers, the use of special transportation or the assistance of another person to leave their place of residence. There is a normal inability to leave the home and doing so requires  considerable and taxing effort. Other absences are for medical reasons / religious services and are infrequent or of short duration when for other reasons). If current dressing causes regression in wound condition, may D/C ordered dressing product/s and apply DESHAY, KIRSTEIN ([id number]) Normal Saline Moist Dressing daily until next Wound Healing Center / Other MD appointment. Notify Wound Healing Center of regression in wound condition at [phone]. Please direct any NON-WOUND related issues/requests for orders to patient's Primary Care Physician Wound #5 Left Lower Leg: Continue Home Health Visits Home Health Nurse may visit PRN to address patient s wound care needs. FACE TO FACE ENCOUNTER: MEDICARE and MEDICAID PATIENTS: I certify that this patient is under my care and that I had a face-to-face encounter that meets the physician face-to-face encounter requirements with this patient on this date. The encounter with the patient was in whole or in part for the following MEDICAL CONDITION: (primary reason for Home Healthcare) MEDICAL NECESSITY: I certify, that based on my findings, NURSING services are a medically necessary home health service. HOME BOUND STATUS: I certify that my clinical findings support that this patient is homebound (i.e., Due to illness or injury, pt requires aid of supportive devices such as crutches, cane, wheelchairs, walkers, the use of special transportation or the assistance of another person to leave their place of residence. There is a normal inability to leave the home and doing so requires considerable and taxing effort. Other absences are for medical reasons / religious services and are infrequent or of short duration when for other reasons). If current dressing causes regression in wound condition, may D/C ordered dressing product/s and apply Normal Saline Moist Dressing daily until next Wound Healing Center / Other MD appointment. Notify Wound Healing Center of  regression in wound condition at [phone]. Please direct any NON-WOUND related issues/requests for orders to patient's Primary Care Physician Wound #6 Right Lower Leg: Continue Home Health Visits Home Health Nurse may visit PRN to address patient s wound care needs. FACE TO FACE ENCOUNTER: MEDICARE and MEDICAID PATIENTS: I certify that this patient is under my care and that I had a face-to-face encounter that meets the physician face-to-face encounter requirements with this patient on this date. The encounter with the patient was in whole or in part for the following MEDICAL CONDITION: (primary reason for Home Healthcare) MEDICAL NECESSITY: I certify, that based on my findings, NURSING services are a medically necessary home health service. HOME BOUND STATUS: I certify that my clinical findings support that this patient is homebound (i.e., Due to illness or injury, pt requires aid of supportive devices such as crutches, cane, wheelchairs, walkers, the use of special transportation or the assistance of another person to leave their place of residence. There is a normal inability to leave the home and doing so requires considerable and taxing effort. Other absences are for medical reasons / religious services and are infrequent or of short duration when for other reasons). If current dressing causes regression in wound condition, may D/C ordered dressing product/s and apply Normal Saline Moist Dressing daily until next Wound Healing Center / Other MD appointment. Notify W[facility]aling Center of regression in wound condition at [phone]. Please direct any NON-WOUND related issues/requests for orders  to patient's Primary Care Physician Medications-please add to medication list.: Wound #1 Right,Anterior Lower Leg: Santyl Enzymatic Ointment - apply nickel thick to patient's wounds Wound #2 Right,Lateral Lower Leg: Santyl Enzymatic Ointment - apply nickel thick to patient's wounds Wound #3  Right,Medial Lower Leg: Santyl Enzymatic Ointment - apply nickel thick to patient's wounds Wound #4 Right,Posterior Lower Leg: Santyl Enzymatic Ointment - apply nickel thick to patient's wounds Wound #5 Left Lower Leg: Santyl Enzymatic Ointment - apply nickel thick to patient's wounds Wound #6 Right Lower Leg: Santyl Enzymatic Ointment - apply nickel thick to patient's wounds Consults ordered were: Terri Terri Barker ([id number]) Vascular - Arterial Studies and Cardiac Consult The following medication(s) was prescribed: Santyl topical 250 unit/gram ointment ointment topical as directed starting 02/23/2016 83 year old patient who has multiple medical comorbidities including diabetes mellitus, defibrillation and possibly CHF has recently come in from [place] and has been having multiple falls due to unsteady gait. After review I have recommended: 1. To establish with a PCP regarding her general health including diabetes mellitus. 2. To see Dr. Nanetta Batty regarding her arterial disease peripherally and also her cardiology workup. 3. Santyl ointment locally to be applied to the wounds with a light Kerlix dressing. 4. elevation and exercise has been discussed in great detail 5. Her DVT study was negative but at some stage she will need a venous reflux study 6. Adequate protein, vitamin A, vitamin C and zinc. Her granddaughter was at the bedside has had all questions answered. Electronic Signature(s) Signed: 02/23/2016 12:49:16 PM By: Evlyn Kanner MD, FACS Previous Signature: 02/23/2016 11:05:50 AM Version By: Evlyn Kanner MD, FACS Previous Signature: 02/23/2016 11:01:48 AM Version By: Evlyn Kanner MD, FACS Entered By: Evlyn Kanner on 02/23/2016 12:49:16 Terri Terri Barker ([id number]) -------------------------------------------------------------------------------- ROS/PFSH Details Patient Name: Terri Terri Barker Date of Service: 02/23/2016 9:00 AM Medical Record Number: [id number] Patient Account Number:  [id number] Date of Birth/Sex: 1927/09/13 (83 y.o. Female) Treating RN: Huel Coventry Primary Care Provider: Clovis Pu, JILL Other Clinician: Referring Provider: Eloisa Northern Treating Provider/Extender: Rudene Re in Treatment: 0 Information Obtained From Patient Wound History Do you currently have one or more open woundso Yes How many open wounds do you currently haveo 7 Approximately how long have you had your woundso 5 weeks How have you been treating your wound(s) until nowo abx, Has your wound(s) ever healed and then re-openedo No Have you had any lab work done in the past montho Yes Have you tested positive for an antibiotic resistant organism (MRSA, VRE)o Yes Date: 01/09/2016 Have you tested positive for osteomyelitis (bone infection)o No Have you had any tests for circulation on your legso No Constitutional Symptoms (General Health) Complaints and Symptoms: Positive for: Fatigue Negative for: Fever; Chills; Marked Weight Change Eyes Complaints and Symptoms: Positive for: Vision Changes Negative for: Dry Eyes; Glasses / Contacts Medical History: Positive for: Cataracts - removed Negative for: Glaucoma; Optic Neuritis Past Medical History Notes: Macular degeneration Cardiovascular Complaints and Symptoms: Positive for: LE edema Negative for: Chest pain Medical History: Positive for: Arrhythmia; Hypertension Negative for: Angina; Congestive Heart Failure; Coronary Artery Disease; Deep Vein Thrombosis; Hypotension; Myocardial Infarction; Peripheral Arterial Disease; Peripheral Venous Disease; Phlebitis; JERRILYNN, MIKOWSKI ([id number]) Vasculitis Endocrine Complaints and Symptoms: Negative for: Hepatitis; Thyroid disease; Polydypsia (Excessive Thirst) Medical History: Positive for: Type II Diabetes Negative for: Type I Diabetes Time with diabetes: 20 years Treated with: Oral agents Blood sugar tested every day: Yes Tested : daily Genitourinary Complaints and  Symptoms: Positive for: Incontinence/dribbling Negative for: Kidney failure/  Dialysis Medical History: Negative for: End Stage Renal Disease Integumentary (Skin) Complaints and Symptoms: Positive for: Wounds; Bleeding or bruising tendency; Swelling Negative for: Breakdown Medical History: Negative for: History of Burn; History of pressure wounds Musculoskeletal Complaints and Symptoms: Positive for: Muscle Pain Medical History: Positive for: Osteoarthritis - Back Negative for: Gout; Rheumatoid Arthritis; Osteomyelitis Neurologic Complaints and Symptoms: Positive for: Numbness/parasthesias - Left hand Medical History: Positive for: Dementia Negative for: Neuropathy; Quadriplegia; Paraplegia; Seizure Disorder REEM, FLEURY ([id number]) Psychiatric Complaints and Symptoms: Positive for: Anxiety Medical History: Negative for: Anorexia/bulimia; Confinement Anxiety Ear/Nose/Mouth/Throat Complaints and Symptoms: No Complaints or Symptoms Medical History: Negative for: Chronic sinus problems/congestion; Middle ear problems Hematologic/Lymphatic Complaints and Symptoms: No Complaints or Symptoms Medical History: Negative for: Anemia; Hemophilia; Human Immunodeficiency Virus; Lymphedema; Sickle Cell Disease Respiratory Complaints and Symptoms: No Complaints or Symptoms Medical History: Negative for: Aspiration; Asthma; Chronic Obstructive Pulmonary Disease (COPD); Pneumothorax; Sleep Apnea; Tuberculosis Gastrointestinal Complaints and Symptoms: No Complaints or Symptoms Medical History: Negative for: Cirrhosis ; Colitis; Crohnos; Hepatitis A; Hepatitis B; Hepatitis C Immunological Complaints and Symptoms: No Complaints or Symptoms Medical History: Negative for: Lupus Erythematosus; Raynaudos; Scleroderma Oncologic Complaints and Symptoms: No Complaints or Symptoms CHARISMA, CHARLOT ([id number]) Medical History: Negative for: Received Chemotherapy; Received Radiation HBO  Extended History Items Eyes: Cataracts Immunizations Pneumococcal Vaccine: Received Pneumococcal Vaccination: Yes Tetanus Vaccine: Last tetanus shot: 10/31/2015 Hospitalization / Surgery History Name of Hospital Purpose of Hospitalization/Surgery Date [facility] [place] 01/09/2016 Family and Social History Former smoker - 30 + years; Marital Status - Widowed; Alcohol Use: Rarely; Drug Use: No History; Caffeine Use: Daily; Advanced Directives: Yes (Not Provided); Patient does not want information on Advanced Directives; Do not resuscitate: No; Living Will: Yes (Not Provided); Medical Power of Attorney: No Physician Affirmation I have reviewed and agree with the above information. Electronic Signature(s) Signed: 02/23/2016 4:07:08 PM By: Evlyn Kanner MD, FACS Signed: 02/24/2016 10:12:04 AM By: Elliot Gurney RN, BSN, Kim RN, BSN Entered By: Evlyn Kanner on 02/23/2016 12:48:58 Terri Terri Barker ([id number]) -------------------------------------------------------------------------------- SuperBill Details Patient Name: Terri Terri Barker Date of Service: 02/23/2016 Medical Record Number: [id number] Patient Account Number: [id number] Date of Birth/Sex: [birth year] (83 y.o. Female) Treating RN: Huel Coventry Primary Care Provider: Clovis Pu, JILL Other Clinician: Referring Provider: Eloisa Northern Treating Provider/Extender: Evlyn Kanner Service Line: Outpatient Weeks in Treatment: 0 Diagnosis Coding ICD-10 Codes Code Description E11.622 Type 2 diabetes mellitus with other skin ulcer S81.811A Laceration without foreign body, right lower leg, initial encounter L97.212 Non-pressure chronic ulcer of right calf with fat layer exposed I89.0 Lymphedema, not elsewhere classified I73.9 Peripheral vascular disease, unspecified Facility Procedures CPT4 Code: [id number] Description: 99205 - WOUND CARE VISIT-LEV 5 NEW PT Modifier: Quantity: 1 Physician Procedures CPT4 Code Description: [id number] 64403 - WC  PHYS LEVEL 4 - NEW PT ICD-10 Description Diagnosis E11.622 Type 2 diabetes mellitus with other skin ulcer S81.811A Laceration without foreign body, right lower leg, init L97.212 Non-pressure chronic ulcer of  right calf with fat laye I89.0 Lymphedema, not elsewhere classified Modifier: ial encounte r exposed Quantity: 1 r Notes Patient is new to the hospital. Electronic Signature(s) Signed: 02/23/2016 4:07:08 PM By: Evlyn Kanner MD, FACS Signed: 02/24/2016 10:12:04 AM By: Elliot Gurney RN, BSN, Kim RN, BSN Previous Signature: 02/23/2016 11:06:06 AM Version By: Evlyn Kanner MD, FACS Entered By: Elliot Gurney RN, BSN, Kim on 02/23/2016 12:47:19

## 2016-02-25 NOTE — Progress Notes (Signed)
Terri Terri Barker, Terri Terri Barker ([id number]) Visit Report for 02/23/2016 Allergy List Details Patient Name: Terri Terri Barker, Terri Terri Barker Date of Service: 02/23/2016 9:00 AM Medical Record Number: [id number] Patient Account Number: [id number] Date of Birth/Sex: Sep 06, 1927 (83 y.o. Female) Treating RN: Huel Coventry Primary Care Jadarion Halbig: Clovis Pu, JILL Other Clinician: Referring Keliyah Spillman: Eloisa Northern Treating Chenise Mulvihill/Extender: Rudene Re in Treatment: 0 Allergies Active Allergies codeine Feldene Sulfa (Sulfonamide Antibiotics) Allergy Notes Electronic Signature(s) Signed: 02/24/2016 10:12:04 AM By: Elliot Gurney, RN, BSN, Kim RN, BSN Entered By: Elliot Gurney, RN, BSN, Kim on 02/23/2016 09:28:46 Terri Terri Barker ([id number]) -------------------------------------------------------------------------------- Arrival Information Details Patient Name: Terri Terri Barker Date of Service: 02/23/2016 9:00 AM Medical Record Number: [id number] Patient Account Number: [id number] Date of Birth/Sex: October 28, 1927 (83 y.o. Female) Treating RN: Huel Coventry Primary Care Breion Novacek: Clovis Pu, JILL Other Clinician: Referring Dariella Gillihan: Eloisa Northern Treating Farrin Shadle/Extender: Rudene Re in Treatment: 0 Visit Information Patient Arrived: Wheel Chair Arrival Time: 09:25 Accompanied By: granddaughter Transfer Assistance: None Patient Identification Verified: Yes Secondary Verification Process Yes Completed: Patient Has Alerts: Yes Patient Alerts: Patient on Blood Thinner Type II Diabetic Coumadin Electronic Signature(s) Signed: 02/24/2016 10:12:04 AM By: Elliot Gurney, RN, BSN, Kim RN, BSN Entered By: Elliot Gurney, RN, BSN, Kim on 02/23/2016 62:95:28 Terri Terri Barker ([id number]) -------------------------------------------------------------------------------- Clinic Level of Care Assessment Details Patient Name: Terri Terri Barker Date of Service: 02/23/2016 9:00 AM Medical Record Number: [id number] Patient Account Number: [id number] Date of Birth/Sex: Mar 11, 1927 (83 y.o.  Female) Treating RN: Huel Coventry Primary Care Maela Takeda: Clovis Pu, JILL Other Clinician: Referring Quinzell Malcomb: Eloisa Northern Treating Leovardo Thoman/Extender: Rudene Re in Treatment: 0 Clinic Level of Care Assessment Items TOOL 2 Quantity Score []  - Use when only an EandM is performed on the INITIAL visit 0 ASSESSMENTS - Nursing Assessment / Reassessment X - General Physical Exam (combine w/ comprehensive assessment (listed just 1 20 below) when performed on new pt. evals) X - Comprehensive Assessment (HX, ROS, Risk Assessments, Wounds Hx, etc.) 1 25 ASSESSMENTS - Wound and Skin Assessment / Reassessment []  - Simple Wound Assessment / Reassessment - one wound 0 X - Complex Wound Assessment / Reassessment - multiple wounds 6 5 []  - Dermatologic / Skin Assessment (not related to wound area) 0 ASSESSMENTS - Ostomy and/or Continence Assessment and Care []  - Incontinence Assessment and Management 0 []  - Ostomy Care Assessment and Management (repouching, etc.) 0 PROCESS - Coordination of Care []  - Simple Patient / Family Education for ongoing care 0 X - Complex (extensive) Patient / Family Education for ongoing care 1 20 X - Staff obtains Chiropractor, Records, Test Results / Process Orders 1 10 []  - Staff telephones HHA, Nursing Homes / Clarify orders / etc 0 []  - Routine Transfer to another Facility (non-emergent condition) 0 []  - Routine Hospital Admission (non-emergent condition) 0 X - New Admissions / Manufacturing engineer / Ordering NPWT, Apligraf, etc. 1 15 []  - Emergency Hospital Admission (emergent condition) 0 X - Simple Discharge Coordination 1 10 Terri Terri Barker, Terri Terri Barker ([id number]) []  - Complex (extensive) Discharge Coordination 0 PROCESS - Special Needs []  - Pediatric / Minor Patient Management 0 []  - Isolation Patient Management 0 []  - Hearing / Language / Visual special needs 0 []  - Assessment of Community assistance (transportation, D/C planning, etc.) 0 []  - Additional assistance /  Altered mentation 0 []  - Support Surface(s) Assessment (bed, cushion, seat, etc.) 0 INTERVENTIONS - Wound Cleansing / Measurement X - Wound Imaging (photographs - any number of wounds) 1 5 []  - Wound Tracing (instead of photographs) 0 []  - Simple Wound Measurement -  one wound 0 X - Complex Wound Measurement - multiple wounds 6 5 []  - Simple Wound Cleansing - one wound 0 X - Complex Wound Cleansing - multiple wounds 6 5 INTERVENTIONS - Wound Dressings []  - Small Wound Dressing one or multiple wounds 0 []  - Medium Wound Dressing one or multiple wounds 0 X - Large Wound Dressing one or multiple wounds 1 20 []  - Application of Medications - injection 0 INTERVENTIONS - Miscellaneous []  - External ear exam 0 []  - Specimen Collection (cultures, biopsies, blood, body fluids, etc.) 0 []  - Specimen(s) / Culture(s) sent or taken to Lab for analysis 0 []  - Patient Transfer (multiple staff / Michiel SitesHoyer Lift / Similar devices) 0 []  - Simple Staple / Suture removal (25 or less) 0 []  - Complex Staple / Suture removal (26 or more) 0 Terri Terri Barker, Terri Terri Barker ([id number]) []  - Hypo / Hyperglycemic Management (close monitor of Blood Glucose) 0 []  - Ankle / Brachial Index (ABI) - do not check if billed separately 0 Has the patient been seen at the hospital within the last three years: Yes Total Score: 215 Level Of Care: New/Established - Level 5 Electronic Signature(s) Signed: 02/24/2016 10:12:04 AM By: Elliot GurneyWoody, RN, BSN, Kim RN, BSN Entered By: Elliot GurneyWoody, RN, BSN, Kim on 02/23/2016 12:46:56 Terri Terri Barker, Terri Barker ([id number]) -------------------------------------------------------------------------------- Encounter Discharge Information Details Patient Name: Terri Terri Barker, Terri Terri Barker Date of Service: 02/23/2016 9:00 AM Medical Record Number: [id number] Patient Account Number: [id number] Date of Birth/Sex: 1927-07-14 (83 y.o. Female) Treating RN: Huel CoventryWoody, Kim Primary Care Dazja Houchin: Clovis PuVAN HORN, JILL Other Clinician: Referring Lamoine Fredricksen: Eloisa NorthernAMIN,  SAAD Treating Minyon Billiter/Extender: Rudene ReBritto, Errol Weeks in Treatment: 0 Encounter Discharge Information Items Discharge Pain Level: 0 Discharge Condition: Stable Ambulatory Status: Walker Discharge Destination: Home Transportation: Private Auto Accompanied By: granddaughter Schedule Follow-up Appointment: Yes Medication Reconciliation completed and provided to Yes Patient/Care Skylier Kretschmer: Provided on Clinical Summary of Care: 02/23/2016 Form Type Recipient Paper Patient JB Electronic Signature(s) Signed: 02/24/2016 10:12:04 AM By: Elliot GurneyWoody, RN, BSN, Kim RN, BSN Previous Signature: 02/23/2016 10:52:09 AM Version By: Gwenlyn PerkingMoore, Shelia Entered By: Elliot GurneyWoody, RN, BSN, Kim on 02/23/2016 12:48:57 Terri Terri Barker, Bethannie ([id number]) -------------------------------------------------------------------------------- Lower Extremity Assessment Details Patient Name: Terri Terri Barker, Terri Barker Date of Service: 02/23/2016 9:00 AM Medical Record Number: [id number] Patient Account Number: [id number] Date of Birth/Sex: 1927-07-14 (83 y.o. Female) Treating RN: Huel CoventryWoody, Kim Primary Care Jerime Arif: Clovis PuVAN HORN, JILL Other Clinician: Referring Barbera Perritt: Eloisa NorthernAMIN, SAAD Treating Xana Bradt/Extender: Rudene ReBritto, Errol Weeks in Treatment: 0 Edema Assessment Assessed: [Left: No] [Right: No] E[Left: dema] [Right: :] Calf Left: Right: Point of Measurement: 32 cm From Medial Instep 31 cm 33 cm Ankle Left: Right: Point of Measurement: 10 cm From Medial Instep 23.2 cm 24 cm Vascular Assessment Claudication: Claudication Assessment [Left:None] [Right:None] Pulses: Dorsalis Pedis Palpable: [Left:Yes] [Right:Yes] Doppler Audible: [Left:Yes] [Right:Yes] Posterior Tibial Palpable: [Left:No] [Right:No] Doppler Audible: [Left:Yes] Extremity colors, hair growth, and conditions: Extremity Color: [Left:Mottled] [Right:Mottled] Hair Growth on Extremity: [Left:No] [Right:No] Temperature of Extremity: [Left:Warm] [Right:Warm] Capillary Refill: [Left:< 3 seconds]  [Right:< 3 seconds] Dependent Rubor: [Left:No] [Right:No] Blanched when Elevated: [Left:No] [Right:No] Lipodermatosclerosis: [Left:No] [Right:No] Toe Nail Assessment Left: Right: Thick: No No Discolored: No No Deformed: No No Terri Terri Barker, Terri Barker ([id number]) Improper Length and Hygiene: No No Notes ABI not completed due to INR 3.2 and blisters all over legs. Electronic Signature(s) Signed: 02/24/2016 10:12:04 AM By: Elliot GurneyWoody, RN, BSN, Kim RN, BSN Entered By: Elliot GurneyWoody, RN, BSN, Kim on 02/23/2016 10:19:40 Terri Terri Barker, Terri Barker ([id number]) -------------------------------------------------------------------------------- Multi Wound Chart Details Patient Name: Terri Terri Barker, Terri Barker Date of Service: 02/23/2016 9:00 AM Medical Record  Number: [id number] Patient Account Number: [id number] Date of Birth/Sex: 11-Jun-1927 (83 y.o. Female) Treating RN: Huel Coventry Primary Care Lecil Tapp: Clovis Pu, JILL Other Clinician: Referring Zala Degrasse: Eloisa Northern Treating Analis Distler/Extender: Rudene Re in Treatment: 0 Vital Signs Height(in): 63 Pulse(bpm): 71 Weight(lbs): 122 Blood Pressure 129/55 (mmHg): Body Mass Index(BMI): 22 Temperature(F): 97.9 Respiratory Rate 16 (breaths/min): Photos: Wound Location: Right Lower Leg - Anterior Right Lower Leg - Lateral Right Lower Leg - Medial Wounding Event: Gradually Appeared Gradually Appeared Gradually Appeared Primary Etiology: Venous Leg Ulcer Venous Leg Ulcer Venous Leg Ulcer Comorbid History: Cataracts, Arrhythmia, Cataracts, Arrhythmia, Cataracts, Arrhythmia, Hypertension, Type II Hypertension, Type II Hypertension, Type II Diabetes, Osteoarthritis, Diabetes, Osteoarthritis, Diabetes, Osteoarthritis, Dementia Dementia Dementia Date Acquired: 01/09/2016 01/09/2016 01/09/2016 Weeks of Treatment: 0 0 0 Wound Status: Open Open Open Measurements L x W x D 2x1x0.2 1x1.2x0.1 1.2x1x0.3 (cm) Area (cm) : 1.571 0.942 0.942 Volume (cm) : 0.314 0.094 0.283 Classification: Partial  Thickness Full Thickness Without Full Thickness Without Exposed Support Exposed Support Structures Structures HBO Classification: Grade 2 Grade 2 Grade 2 Exudate Amount: Large N/A Large Exudate Type: Serous N/A Serous Exudate Color: amber N/A amber Wound Margin: Flat and Intact Flat and Intact Flat and Intact Granulation Amount: None Present (0%) Small (1-33%) None Present (0%) Necrotic Amount: Large (67-100%) Large (67-100%) Large (67-100%) Terri Terri Barker, Terri Terri Barker ([id number]) Necrotic Tissue: Eschar Adherent Slough N/A Exposed Structures: Fat Layer (Subcutaneous Fat Layer (Subcutaneous Fat Layer (Subcutaneous Tissue) Exposed: Yes Tissue) Exposed: Yes Tissue) Exposed: Yes Fascia: No Fascia: No Fascia: No Tendon: No Tendon: No Tendon: No Muscle: No Muscle: No Muscle: No Joint: No Joint: No Joint: No Bone: No Bone: No Bone: No Epithelialization: N/A None None Periwound Skin Texture: No Abnormalities Noted Excoriation: No Excoriation: Yes Induration: No Induration: No Callus: No Callus: No Crepitus: No Crepitus: No Rash: No Rash: No Scarring: No Scarring: No Periwound Skin Maceration: Yes Maceration: No Maceration: No Moisture: Dry/Scaly: No Dry/Scaly: No Periwound Skin Color: No Abnormalities Noted Atrophie Blanche: No Atrophie Blanche: No Cyanosis: No Cyanosis: No Ecchymosis: No Ecchymosis: No Erythema: No Erythema: No Hemosiderin Staining: No Hemosiderin Staining: No Mottled: No Mottled: No Pallor: No Pallor: No Rubor: No Rubor: No Tenderness on No No No Palpation: Wound Preparation: Ulcer Cleansing: Ulcer Cleansing: Ulcer Cleansing: Not Rinsed/Irrigated with Rinsed/Irrigated with Cleansed Saline Saline Topical Anesthetic Topical Anesthetic Applied: None Applied: None Wound Number: [id number]  Photos: Wound Location: Right Lower Leg - Left Lower Leg Right Lower Leg Posterior Wounding Event: Trauma Trauma Trauma Primary Etiology: Venous Leg Ulcer  Venous Leg Ulcer Venous Leg Ulcer Comorbid History: Cataracts, Arrhythmia, Cataracts, Arrhythmia, Cataracts, Arrhythmia, Hypertension, Type II Hypertension, Type II Hypertension, Type II Diabetes, Osteoarthritis, Diabetes, Osteoarthritis, Diabetes, Osteoarthritis, Dementia Dementia Dementia Date Acquired: 01/09/2016 01/09/2016 01/09/2016 Weeks of Treatment: 0 0 0 Terri Terri Barker, Terri Terri Barker ([id number]) Wound Status: Open Open Open Measurements L x W x D 2.2x1.5x0.4 1.5x1.8x0.1 1x2.5x0.1 (cm) Area (cm) : 2.592 2.121 1.963 Volume (cm) : 1.037 0.212 0.196 Classification: Full Thickness Without Partial Thickness Full Thickness Without Exposed Support Exposed Support Structures Structures HBO Classification: Grade 1 Grade 1 Grade 2 Exudate Amount: Large Large Large Exudate Type: Serous N/A Serous Exudate Color: amber N/A amber Wound Margin: Flat and Intact Flat and Intact Flat and Intact Granulation Amount: N/A None Present (0%) None Present (0%) Necrotic Amount: N/A Large (67-100%) Large (67-100%) Necrotic Tissue: N/A Eschar Eschar Exposed Structures: Fat Layer (Subcutaneous Fascia: No Fascia: No Tissue) Exposed: Yes Fat Layer (Subcutaneous Fat Layer (Subcutaneous Fascia: No Tissue)  Exposed: No Tissue) Exposed: No Tendon: No Tendon: No Tendon: No Muscle: No Muscle: No Muscle: No Joint: No Joint: No Joint: No Bone: No Bone: No Bone: No Limited to Skin Limited to Skin Breakdown Breakdown Epithelialization: N/A None None Periwound Skin Texture: No Abnormalities Noted No Abnormalities Noted Excoriation: No Induration: No Callus: No Crepitus: No Rash: No Scarring: No Periwound Skin No Abnormalities Noted No Abnormalities Noted Maceration: No Moisture: Dry/Scaly: No Periwound Skin Color: No Abnormalities Noted No Abnormalities Noted Atrophie Blanche: No Cyanosis: No Ecchymosis: No Erythema: No Hemosiderin Staining: No Mottled: No Pallor: No Rubor: No Tenderness on No No  No Palpation: Wound Preparation: N/A N/A N/A Treatment Notes Electronic Signature(s) Terri Barker, PIETRZAK ([id number]) Signed: 02/23/2016 10:55:52 AM By: Evlyn Kanner MD, FACS Entered By: Evlyn Kanner on 02/23/2016 10:55:51 Terri Terri Barker ([id number]) -------------------------------------------------------------------------------- Multi-Disciplinary Care Plan Details Patient Name: Terri Terri Barker Date of Service: 02/23/2016 9:00 AM Medical Record Number: [id number] Patient Account Number: [id number] Date of Birth/Sex: [birth year] (83 y.o. Female) Treating RN: Huel Coventry Primary Care Deshanna Kama: Clovis Pu, JILL Other Clinician: Referring Deby Adger: Eloisa Northern Treating Winston Sobczyk/Extender: Rudene Re in Treatment: 0 Active Inactive ` Abuse / Safety / Falls / Self Care Management Nursing Diagnoses: Potential for falls Goals: Patient will remain injury free Date Initiated: 02/23/2016 Target Resolution Date: 04/02/2016 Goal Status: Active Patient/caregiver will verbalize understanding of skin care regimen Date Initiated: 02/23/2016 Target Resolution Date: 04/02/2016 Goal Status: Active Interventions: Assess fall risk on admission and as needed Provide education on fall prevention Treatment Activities: Patient referred to home care : 02/23/2016 Notes: ` Nutrition Nursing Diagnoses: Impaired glucose control: actual or potential Goals: Patient/caregiver verbalizes understanding of need to maintain therapeutic glucose control per primary care physician Date Initiated: 02/23/2016 Target Resolution Date: 04/02/2016 Goal Status: Active Interventions: Provide education on elevated blood sugars and impact on wound healing KESHONNA, VALVO ([id number]) Notes: ` Orientation to the Wound Care Program Nursing Diagnoses: Knowledge deficit related to the wound healing center program Goals: Patient/caregiver will verbalize understanding of the Wound Healing Center Program Date Initiated:  02/23/2016 Target Resolution Date: 03/05/2016 Goal Status: Active Interventions: Provide education on orientation to the wound center Notes: ` Wound/Skin Impairment Nursing Diagnoses: Impaired tissue integrity Goals: Ulcer/skin breakdown will heal within 14 weeks Date Initiated: 02/23/2016 Target Resolution Date: 06/07/2016 Goal Status: Active Interventions: Assess patient/caregiver ability to obtain necessary supplies Assess ulceration(s) every visit Notes: Electronic Signature(s) Signed: 02/24/2016 10:12:04 AM By: Elliot Gurney, RN, BSN, Kim RN, BSN Entered By: Elliot Gurney, RN, BSN, Kim on 02/23/2016 12:44:05 Terri Terri Barker ([id number]) -------------------------------------------------------------------------------- Pain Assessment Details Patient Name: Terri Terri Barker Date of Service: 02/23/2016 9:00 AM Medical Record Number: [id number] Patient Account Number: [id number] Date of Birth/Sex: [birth year] (83 y.o. Female) Treating RN: Huel Coventry Primary Care Jaylon Grode: Clovis Pu, JILL Other Clinician: Referring Sonda Coppens: Eloisa Northern Treating Lacretia Tindall/Extender: Rudene Re in Treatment: 0 Active Problems Location of Pain Severity and Description of Pain Patient Has Paino No Site Locations With Dressing Change: No Pain Management and Medication Current Pain Management: Electronic Signature(s) Signed: 02/24/2016 10:12:04 AM By: Elliot Gurney, RN, BSN, Kim RN, BSN Entered By: Elliot Gurney, RN, BSN, Kim on 02/23/2016 09:26:35 Terri Terri Barker ([id number]) -------------------------------------------------------------------------------- Patient/Caregiver Education Details Patient Name: Terri Terri Barker Date of Service: 02/23/2016 9:00 AM Medical Record Number: [id number] Patient Account Number: [id number] Date of Birth/Gender: April 22, 1927 (83 y.o. Female) Treating RN: Huel Coventry Primary Care Physician: Clovis Pu, JILL Other Clinician: Referring Physician: Eloisa Northern Treating Physician/Extender: Rudene Re in  Treatment: 0 Education Assessment Education Provided To: Patient Education  Topics Provided Elevated Blood Sugar/ Impact on Healing: Handouts: Elevated Blood Sugars: How Do They Affect Wound Healing Methods: Demonstration, Explain/Verbal Responses: State content correctly Safety: Handouts: Personal Safety Methods: Demonstration, Explain/Verbal Responses: State content correctly Welcome To The Wound Care Center: Handouts: Welcome To The Wound [facility] Methods: Demonstration, Explain/Verbal Responses: State content correctly Electronic Signature(s) Signed: 02/24/2016 10:12:04 AM By: Elliot Gurney, RN, BSN, Kim RN, BSN Entered By: Elliot Gurney, RN, BSN, Kim on 02/23/2016 12:49:36 Terri Terri Barker ([id number]) -------------------------------------------------------------------------------- Wound Assessment Details Patient Name: Terri Terri Barker Date of Service: 02/23/2016 9:00 AM Medical Record Number: [id number] Patient Account Number: [id number] Date of Birth/Sex: 07/04/1927 (83 y.o. Female) Treating RN: Huel Coventry Primary Care Kendell Gammon: Clovis Pu, JILL Other Clinician: Referring Cherrish Vitali: Eloisa Northern Treating Keigan Tafoya/Extender: Rudene Re in Treatment: 0 Wound Status Wound Number: 1 Primary Venous Leg Ulcer Etiology: Wound Location: Right Lower Leg - Anterior Wound Open Wounding Event: Gradually Appeared Status: Date Acquired: 01/09/2016 Comorbid Cataracts, Arrhythmia, Hypertension, Weeks Of Treatment: 0 History: Type II Diabetes, Osteoarthritis, Clustered Wound: No Dementia Photos Wound Measurements Length: (cm) 2 % Reduction Width: (cm) 1 % Reduction Depth: (cm) 0.2 Area: (cm) 1.571 Volume: (cm) 0.314 in Area: in Volume: Wound Description Classification: Partial Thickness Foul Odor A Diabetic Severity (Wagner): Grade 2 Slough/Fibr Wound Margin: Flat and Intact Exudate Amount: Large Exudate Type: Serous Exudate Color: amber fter Cleansing: No ino Yes Wound  Bed Granulation Amount: None Present (0%) Exposed Structure Necrotic Amount: Large (67-100%) Fascia Exposed: No Necrotic Quality: Eschar Fat Layer (Subcutaneous Tissue) Exposed: Yes Tendon Exposed: No Muscle Exposed: No Joint Exposed: No JAYLIE, NEAVES ([id number]) Bone Exposed: No Periwound Skin Texture Texture Color No Abnormalities Noted: No No Abnormalities Noted: No Moisture No Abnormalities Noted: No Maceration: Yes Wound Preparation Ulcer Cleansing: Rinsed/Irrigated with Saline Electronic Signature(s) Signed: 02/24/2016 10:12:04 AM By: Elliot Gurney, RN, BSN, Kim RN, BSN Entered By: Elliot Gurney, RN, BSN, Kim on 02/23/2016 10:06:52 Terri Terri Barker ([id number]) -------------------------------------------------------------------------------- Wound Assessment Details Patient Name: Terri Terri Barker Date of Service: 02/23/2016 9:00 AM Medical Record Number: [id number] Patient Account Number: [id number] Date of Birth/Sex: [birth year] (83 y.o. Female) Treating RN: Huel Coventry Primary Care Asyah Candler: Clovis Pu, JILL Other Clinician: Referring Ilianna Bown: Eloisa Northern Treating Kriston Pasquarello/Extender: Rudene Re in Treatment: 0 Wound Status Wound Number: 2 Primary Venous Leg Ulcer Etiology: Wound Location: Right Lower Leg - Lateral Wound Open Wounding Event: Gradually Appeared Status: Date Acquired: 01/09/2016 Comorbid Cataracts, Arrhythmia, Hypertension, Weeks Of Treatment: 0 History: Type II Diabetes, Osteoarthritis, Clustered Wound: No Dementia Photos Wound Measurements Length: (cm) 1 % Reduction i Width: (cm) 1.2 % Reduction i Depth: (cm) 0.1 Epithelializa Area: (cm) 0.942 Tunneling: Volume: (cm) 0.094 Undermining: n Area: n Volume: tion: None No No Wound Description Full Thickness Without Foul Odor Aft Classification: Exposed Support Structures Slough/Fibrin Diabetic Severity Grade 2 (Wagner): Wound Margin: Flat and Intact er Cleansing: No o Yes Wound Bed Granulation  Amount: Small (1-33%) Exposed Structure Granulation Quality: Hyper-granulation Fascia Exposed: No Necrotic Amount: Large (67-100%) Fat Layer (Subcutaneous Tissue) Exposed: Yes Necrotic Quality: Adherent Slough Tendon Exposed: No Muscle Exposed: No Joint Exposed: No Bone Exposed: No CALVIN, CHURA ([id number]) Periwound Skin Texture Texture Color No Abnormalities Noted: No No Abnormalities Noted: No Callus: No Atrophie Blanche: No Crepitus: No Cyanosis: No Excoriation: No Ecchymosis: No Induration: No Erythema: No Rash: No Hemosiderin Staining: No Scarring: No Mottled: No Pallor: No Moisture Rubor: No No Abnormalities Noted: No Dry / Scaly: No Maceration: No Wound Preparation Ulcer Cleansing: Rinsed/Irrigated with Saline Topical Anesthetic Applied: None Electronic  Signature(s) Signed: 02/24/2016 10:12:04 AM By: Elliot Gurney RN, BSN, Kim RN, BSN Entered By: Elliot Gurney, RN, BSN, Kim on 02/23/2016 10:08:53 Terri Terri Barker ([id number]) -------------------------------------------------------------------------------- Wound Assessment Details Patient Name: Terri Terri Barker Date of Service: 02/23/2016 9:00 AM Medical Record Number: [id number] Patient Account Number: [id number] Date of Birth/Sex: 10-25-1927 (83 y.o. Female) Treating RN: Huel Coventry Primary Care Piya Mesch: Clovis Pu, JILL Other Clinician: Referring Shandora Koogler: Eloisa Northern Treating Llewellyn Choplin/Extender: Rudene Re in Treatment: 0 Wound Status Wound Number: 3 Primary Venous Leg Ulcer Etiology: Wound Location: Right Lower Leg - Medial Wound Open Wounding Event: Gradually Appeared Status: Date Acquired: 01/09/2016 Comorbid Cataracts, Arrhythmia, Hypertension, Weeks Of Treatment: 0 History: Type II Diabetes, Osteoarthritis, Clustered Wound: No Dementia Photos Wound Measurements Length: (cm) 1.2 Width: (cm) 1 Depth: (cm) 0.3 Area: (cm) 0.942 Volume: (cm) 0.283 % Reduction in Area: % Reduction in  Volume: Epithelialization: None Tunneling: No Undermining: No Wound Description Full Thickness Without Classification: Exposed Support Structures Diabetic Severity Grade 2 (Wagner): Wound Margin: Flat and Intact Exudate Amount: Large Exudate Type: Serous Exudate Color: amber Foul Odor After Cleansing: No Wound Bed Granulation Amount: None Present (0%) Exposed Structure Necrotic Amount: Large (67-100%) Fascia Exposed: No Fat Layer (Subcutaneous Tissue) Exposed: Yes Tendon Exposed: No JACARA, BENITO ([id number]) Muscle Exposed: No Joint Exposed: No Bone Exposed: No Periwound Skin Texture Texture Color No Abnormalities Noted: No No Abnormalities Noted: No Callus: No Atrophie Blanche: No Crepitus: No Cyanosis: No Excoriation: Yes Ecchymosis: No Induration: No Erythema: No Rash: No Hemosiderin Staining: No Scarring: No Mottled: No Pallor: No Moisture Rubor: No No Abnormalities Noted: No Dry / Scaly: No Maceration: No Wound Preparation Ulcer Cleansing: Not Cleansed Topical Anesthetic Applied: None Electronic Signature(s) Signed: 02/24/2016 10:12:04 AM By: Elliot Gurney, RN, BSN, Kim RN, BSN Entered By: Elliot Gurney, RN, BSN, Kim on 02/23/2016 10:12:58 Terri Terri Barker ([id number]) -------------------------------------------------------------------------------- Wound Assessment Details Patient Name: Terri Terri Barker Date of Service: 02/23/2016 9:00 AM Medical Record Number: [id number] Patient Account Number: [id number] Date of Birth/Sex: January 15, 1927 (83 y.o. Female) Treating RN: Huel Coventry Primary Care Pryor Guettler: Clovis Pu, JILL Other Clinician: Referring Abaigeal Moomaw: Eloisa Northern Treating Stephen Baruch/Extender: Rudene Re in Treatment: 0 Wound Status Wound Number: 4 Primary Venous Leg Ulcer Etiology: Wound Location: Right Lower Leg - Posterior Wound Open Wounding Event: Trauma Status: Date Acquired: 01/09/2016 Comorbid Cataracts, Arrhythmia, Hypertension, Weeks Of Treatment:  0 History: Type II Diabetes, Osteoarthritis, Clustered Wound: No Dementia Photos Wound Measurements Length: (cm) 2.2 % Reduction Width: (cm) 1.5 % Reduction Depth: (cm) 0.4 Area: (cm) 2.592 Volume: (cm) 1.037 in Area: in Volume: Wound Description Full Thickness Without Exposed Classification: Support Structures Diabetic Severity Grade 1 (Wagner): Wound Margin: Flat and Intact Exudate Amount: Large Exudate Type: Serous Exudate Color: amber Wound Bed Exposed Structure Fascia Exposed: No Fat Layer (Subcutaneous Tissue) Exposed: Yes Tendon Exposed: No NITI, LEISURE ([id number]) Muscle Exposed: No Joint Exposed: No Bone Exposed: No Periwound Skin Texture Texture Color No Abnormalities Noted: No No Abnormalities Noted: No Moisture No Abnormalities Noted: No Electronic Signature(s) Signed: 02/24/2016 10:12:04 AM By: Elliot Gurney, RN, BSN, Kim RN, BSN Entered By: Elliot Gurney, RN, BSN, Kim on 02/23/2016 10:14:22 Terri Terri Barker ([id number]) -------------------------------------------------------------------------------- Wound Assessment Details Patient Name: Terri Terri Barker Date of Service: 02/23/2016 9:00 AM Medical Record Number: [id number] Patient Account Number: [id number] Date of Birth/Sex: 1927-01-13 (83 y.o. Female) Treating RN: Huel Coventry Primary Care Sama Arauz: Clovis Pu, JILL Other Clinician: Referring Tian Mcmurtrey: Eloisa Northern Treating Keygan Dumond/Extender: Rudene Re in Treatment: 0 Wound Status Wound Number: 5 Primary Venous Leg Ulcer Etiology: Wound Location:  Left Lower Leg Wound Open Wounding Event: Trauma Status: Date Acquired: 01/09/2016 Comorbid Cataracts, Arrhythmia, Hypertension, Weeks Of Treatment: 0 History: Type II Diabetes, Osteoarthritis, Clustered Wound: No Dementia Photos Wound Measurements Length: (cm) 1.5 Width: (cm) 1.8 Depth: (cm) 0.1 Area: (cm) 2.121 Volume: (cm) 0.212 % Reduction in Area: % Reduction in Volume: Epithelialization:  None Tunneling: No Undermining: No Wound Description Classification: Partial Thickness Foul Odor A Diabetic Severity (Wagner): Grade 1 Slough/Fibr Wound Margin: Flat and Intact Exudate Amount: Large fter Cleansing: No ino No Wound Bed Granulation Amount: None Present (0%) Exposed Structure Necrotic Amount: Large (67-100%) Fascia Exposed: No Necrotic Quality: Eschar Fat Layer (Subcutaneous Tissue) Exposed: No Tendon Exposed: No Muscle Exposed: No Joint Exposed: No Bone Exposed: No Limited to Skin GLORY, GRAEFE ([id number]) Periwound Skin Texture Texture Color No Abnormalities Noted: No No Abnormalities Noted: No Moisture No Abnormalities Noted: No Electronic Signature(s) Signed: 02/24/2016 10:12:04 AM By: Elliot Gurney, RN, BSN, Kim RN, BSN Entered By: Elliot Gurney, RN, BSN, Kim on 02/23/2016 10:15:58 Terri Terri Barker ([id number]) -------------------------------------------------------------------------------- Wound Assessment Details Patient Name: Terri Terri Barker Date of Service: 02/23/2016 9:00 AM Medical Record Number: [id number] Patient Account Number: [id number] Date of Birth/Sex: 07/02/1927 (83 y.o. Female) Treating RN: Huel Coventry Primary Care Sherill Mangen: Clovis Pu, JILL Other Clinician: Referring Rankin Coolman: Eloisa Northern Treating Danilyn Cocke/Extender: Rudene Re in Treatment: 0 Wound Status Wound Number: 6 Primary Venous Leg Ulcer Etiology: Wound Location: Right Lower Leg Wound Open Wounding Event: Trauma Status: Date Acquired: 01/09/2016 Comorbid Cataracts, Arrhythmia, Hypertension, Weeks Of Treatment: 0 History: Type II Diabetes, Osteoarthritis, Clustered Wound: No Dementia Photos Wound Measurements Length: (cm) 1 Width: (cm) 2.5 Depth: (cm) 0.1 Area: (cm) 1.963 Volume: (cm) 0.196 % Reduction in Area: % Reduction in Volume: Epithelialization: None Tunneling: No Undermining: No Wound Description Full Thickness Without Foul Odor Aft Classification: Exposed  Support Structures Slough/Fibrin Diabetic Severity Grade 2 (Wagner): Wound Margin: Flat and Intact Exudate Amount: Large Exudate Type: Serous Exudate Color: amber er Cleansing: No o No Wound Bed Granulation Amount: None Present (0%) Exposed Structure Necrotic Amount: Large (67-100%) Fascia Exposed: No Necrotic Quality: Eschar Fat Layer (Subcutaneous Tissue) Exposed: No Tendon Exposed: No PAULA, BUSENBARK ([id number]) Muscle Exposed: No Joint Exposed: No Bone Exposed: No Limited to Skin Breakdown Periwound Skin Texture Texture Color No Abnormalities Noted: No No Abnormalities Noted: No Callus: No Atrophie Blanche: No Crepitus: No Cyanosis: No Excoriation: No Ecchymosis: No Induration: No Erythema: No Rash: No Hemosiderin Staining: No Scarring: No Mottled: No Pallor: No Moisture Rubor: No No Abnormalities Noted: No Dry / Scaly: No Maceration: No Electronic Signature(s) Signed: 02/24/2016 10:12:04 AM By: Elliot Gurney, RN, BSN, Kim RN, BSN Entered By: Elliot Gurney, RN, BSN, Kim on 02/23/2016 10:17:35 Terri Terri Barker ([id number]) -------------------------------------------------------------------------------- Vitals Details Patient Name: Terri Terri Barker Date of Service: 02/23/2016 9:00 AM Medical Record Number: [id number] Patient Account Number: [id number] Date of Birth/Sex: 1927-10-26 (83 y.o. Female) Treating RN: Huel Coventry Primary Care Ifeoluwa Bartz: Clovis Pu, JILL Other Clinician: Referring Drayven Marchena: Eloisa Northern Treating Lauralie Blacksher/Extender: Rudene Re in Treatment: 0 Vital Signs Time Taken: 09:26 Temperature (F): 97.9 Height (in): 63 Pulse (bpm): 71 Weight (lbs): 122 Respiratory Rate (breaths/min): 16 Source: Measured Blood Pressure (mmHg): 129/55 Body Mass Index (BMI): 21.6 Reference Range: 80 - 120 mg / dl Electronic Signature(s) Signed: 02/24/2016 10:12:04 AM By: Elliot Gurney, RN, BSN, Kim RN, BSN Entered By: Elliot Gurney, RN, BSN, Kim on 02/23/2016 09:27:14

## 2016-02-25 NOTE — Progress Notes (Signed)
Terri Terri Barker, Terri Barker ([id number]) Visit Report for 02/23/2016 Abuse/Suicide Risk Screen Details Patient Name: Terri Terri Barker, Terri Terri Barker Date of Service: 02/23/2016 9:00 AM Medical Record Number: [id number] Patient Account Number: [id number] Date of Birth/Sex: 1927-09-20 (83 y.o. Female) Treating RN: Huel Coventry Primary Care Bridgett Hattabaugh: Clovis Pu, JILL Other Clinician: Referring Lonzell Dorris: Eloisa Northern Treating Arlyn Bumpus/Extender: Rudene Re in Treatment: 0 Abuse/Suicide Risk Screen Items Answer ABUSE/SUICIDE RISK SCREEN: Has anyone close to you tried to hurt or harm you recentlyo No Do you feel uncomfortable with anyone in your familyo No Has anyone forced you do things that you didnot want to doo No Do you have any thoughts of harming yourselfo No Patient displays signs or symptoms of abuse and/or neglect. No Electronic Signature(s) Signed: 02/24/2016 10:12:04 AM By: Elliot Gurney RN, BSN, Kim RN, BSN Entered By: Elliot Gurney, RN, BSN, Kim on 02/23/2016 09:59:29 Terri Terri Barker ([id number]) -------------------------------------------------------------------------------- Activities of Daily Living Details Patient Name: Terri Terri Barker Date of Service: 02/23/2016 9:00 AM Medical Record Number: [id number] Patient Account Number: [id number] Date of Birth/Sex: 10/12/1927 (83 y.o. Female) Treating RN: Huel Coventry Primary Care Milayah Krell: Clovis Pu, JILL Other Clinician: Referring Shawnique Mariotti: Eloisa Northern Treating Betzy Barbier/Extender: Rudene Re in Treatment: 0 Activities of Daily Living Items Answer Activities of Daily Living (Please select one for each item) Drive Automobile Not Able Take Medications Need Assistance Use Telephone Need Assistance Care for Appearance Need Assistance Use Toilet Need Assistance Bath / Shower Need Assistance Dress Self Need Assistance Feed Self Need Assistance Walk Need Assistance Get In / Out Bed Need Assistance Housework Need Assistance Prepare Meals Need Assistance Handle Money Need  Assistance Shop for Self Need Assistance Electronic Signature(s) Signed: 02/24/2016 10:12:04 AM By: Elliot Gurney, RN, BSN, Kim RN, BSN Entered By: Elliot Gurney, RN, BSN, Kim on 02/23/2016 09:59:43 Terri Terri Barker ([id number]) -------------------------------------------------------------------------------- Education Assessment Details Patient Name: Terri Terri Barker Date of Service: 02/23/2016 9:00 AM Medical Record Number: [id number] Patient Account Number: [id number] Date of Birth/Sex: 1927-12-22 (83 y.o. Female) Treating RN: Huel Coventry Primary Care Teria Khachatryan: Clovis Pu, JILL Other Clinician: Referring Braylynn Ghan: Eloisa Northern Treating Ephram Kornegay/Extender: Rudene Re in Treatment: 0 Primary Learner Assessed: Caregiver Lodema Pilot Reason Patient is not Primary Learner: Dementia Learning Preferences/Education Level/Primary Language Learning Preference: Explanation, Demonstration Highest Education Level: College or Above Preferred Language: English Cognitive Barrier Assessment/Beliefs Language Barrier: No Translator Needed: No Memory Deficit: No Emotional Barrier: No Cultural/Religious Beliefs Affecting Medical No Care: Physical Barrier Assessment Impaired Vision: Yes Glasses Impaired Hearing: No Decreased Hand dexterity: No Knowledge/Comprehension Assessment Knowledge Level: High Comprehension Level: High Ability to understand written High instructions: Ability to understand verbal High instructions: Motivation Assessment Anxiety Level: Calm Cooperation: Cooperative Education Importance: Acknowledges Need Interest in Health Problems: Asks Questions Perception: Coherent Willingness to Engage in Self- High Management Activities: Electronic Signature(s) Signed: 02/24/2016 10:12:04 AM By: Elliot Gurney, RN, BSN, Kim RN, BSN [place], [place] ([id number]) Entered By: Elliot Gurney, RN, BSN, Kim on 02/23/2016 10:00:35 Terri Terri Barker  ([id number]) -------------------------------------------------------------------------------- Fall Risk Assessment Details Patient Name: Terri Terri Barker Date of Service: 02/23/2016 9:00 AM Medical Record Number: [id number] Patient Account Number: [id number] Date of Birth/Sex: 1927/02/25 (83 y.o. Female) Treating RN: Huel Coventry Primary Care Betzalel Umbarger: Clovis Pu, JILL Other Clinician: Referring Denver Harder: Eloisa Northern Treating Zyler Hyson/Extender: Rudene Re in Treatment: 0 Fall Risk Assessment Items Have you had 2 or more falls in the last 12 monthso 0 Yes Have you had any fall that resulted in injury in the last 12 monthso 0 Yes FALL RISK ASSESSMENT: History of falling - immediate or within 3 months 25  Yes Secondary diagnosis 0 No Ambulatory aid None/bed rest/wheelchair/nurse 0 No Crutches/cane/walker 15 Yes Furniture 0 No IV Access/Saline Lock 0 No Gait/Training Normal/bed rest/immobile 0 Yes Weak 10 Yes Impaired 0 No Mental Status Oriented to own ability 0 No Electronic Signature(s) Signed: 02/24/2016 10:12:04 AM By: Elliot GurneyWoody, RN, BSN, Kim RN, BSN Entered By: Elliot GurneyWoody, RN, BSN, Kim on 02/23/2016 10:00:58 Terri RouteBARKER, Terri Barker ([id number]) -------------------------------------------------------------------------------- Foot Assessment Details Patient Name: Terri RouteBARKER, Terri Terri Barker Date of Service: 02/23/2016 9:00 AM Medical Record Number: [id number] Patient Account Number: [id number] Date of Birth/Sex: Aug 14, 1927 (83 y.o. Female) Treating RN: Huel CoventryWoody, Kim Primary Care Prarthana Parlin: Clovis PuVAN HORN, JILL Other Clinician: Referring Ginia Rudell: Eloisa NorthernAMIN, SAAD Treating Akire Rennert/Extender: Rudene ReBritto, Errol Weeks in Treatment: 0 Foot Assessment Items Site Locations + = Sensation present, - = Sensation absent, C = Callus, U = Ulcer R = Redness, W = Warmth, M = Maceration, PU = Pre-ulcerative lesion F = Fissure, S = Swelling, D = Dryness Assessment Right: Left: Other Deformity: No No Prior Foot Ulcer: No No Prior Amputation: No  No Charcot Joint: No No Ambulatory Status: Ambulatory With Help Assistance Device: Walker GaitFutures trader: Steady Electronic Signature(s) Signed: 02/24/2016 10:12:04 AM By: Elliot GurneyWoody, RN, BSN, Kim RN, BSN Entered By: Elliot GurneyWoody, RN, BSN, Kim on 02/23/2016 10:02:25 Terri RouteBARKER, Terri Barker ([id number]) -------------------------------------------------------------------------------- Nutrition Risk Assessment Details Patient Name: Terri RouteBARKER, Terri Terri Barker Date of Service: 02/23/2016 9:00 AM Medical Record Number: [id number] Patient Account Number: [id number] Date of Birth/Sex: Aug 14, 1927 (83 y.o. Female) Treating RN: Huel CoventryWoody, Kim Primary Care Ren Aspinall: Clovis PuVAN HORN, JILL Other Clinician: Referring Juniper Snyders: Eloisa NorthernAMIN, SAAD Treating Arpita Fentress/Extender: Rudene ReBritto, Errol Weeks in Treatment: 0 Height (in): 63 Weight (lbs): 122 Body Mass Index (BMI): 21.6 Nutrition Risk Assessment Items NUTRITION RISK SCREEN: I have an illness or condition that made me change the kind and/or 0 No amount of food I eat I eat fewer than two meals per day 0 No I eat few fruits and vegetables, or milk products 0 No I have three or more drinks of beer, liquor or wine almost every day 0 No I have tooth or mouth problems that make it hard for me to eat 0 No I don't always have enough money to buy the food I need 0 No I eat alone most of the time 0 No I take three or more different prescribed or over-the-counter drugs a 0 No day Without wanting to, I have lost or gained 10 pounds in the last six 0 No months I am not always physically able to shop, cook and/or feed myself 0 No Nutrition Protocols Good Risk Protocol 0 No interventions needed Moderate Risk Protocol Electronic Signature(s) Signed: 02/24/2016 10:12:04 AM By: Elliot GurneyWoody, RN, BSN, Kim RN, BSN Entered By: Elliot GurneyWoody, RN, BSN, Kim on 02/23/2016 10:01:16

## 2016-02-28 ENCOUNTER — Telehealth: Payer: Self-pay | Admitting: Cardiovascular Disease

## 2016-02-28 NOTE — Telephone Encounter (Signed)
[facility]

## 2016-03-01 ENCOUNTER — Encounter: Payer: Medicare Other | Admitting: Surgery

## 2016-03-01 DIAGNOSIS — E11622 Type 2 diabetes mellitus with other skin ulcer: Secondary | ICD-10-CM | POA: Diagnosis not present

## 2016-03-02 NOTE — Progress Notes (Addendum)
Terri Barker ([id number]) Visit Report for 03/01/2016 Arrival Information Details Patient Name: Terri Barker, Terri Barker Date of Service: 03/01/2016 12:45 PM Medical Record Number: [id number] Patient Account Number: [id number] Date of Birth/Sex: [birth year] (83 y.o. Female) Treating RN: Afful, RN, BSN, [place] Sink Primary Care Alisson Rozell: Clovis Pu, JILL Other Clinician: Referring Wilmetta Speiser: Clovis Pu, JILL Treating Treyvone Chelf/Extender: Rudene Re in Treatment: 1 Visit Information History Since Last Visit All ordered tests and consults were completed: No Patient Arrived: Dan Humphreys Added or deleted any medications: No Arrival Time: 12:51 Any new allergies or adverse reactions: No Accompanied By: caregiver Had a fall or experienced change in No Transfer Assistance: None activities of daily living that may affect Patient Identification Verified: Yes risk of falls: Secondary Verification Process Yes Signs or symptoms of abuse/neglect since last No Completed: visito Patient Has Alerts: Yes Hospitalized since last visit: No Patient Alerts: Patient on Blood Has Dressing in Place as Prescribed: Yes Thinner Pain Present Now: No Type II Diabetic Eliquis Electronic Signature(s) Signed: 03/01/2016 4:29:56 PM By: Elpidio Eric BSN, RN Entered By: Elpidio Eric on 03/01/2016 13:27:44 Terri Barker ([id number]) -------------------------------------------------------------------------------- Encounter Discharge Information Details Patient Name: Terri Barker Date of Service: 03/01/2016 12:45 PM Medical Record Number: [id number] Patient Account Number: [id number] Date of Birth/Sex: 1927/07/16 (83 y.o. Female) Treating RN: Afful, RN, BSN, [place] Sink Primary Care Abhiram Criado: Clovis Pu, JILL Other Clinician: Referring Alamin Mccuiston: Clovis Pu, JILL Treating Dniyah Grant/Extender: Rudene Re in Treatment: 1 Encounter Discharge Information Items Discharge Pain Level: 0 Discharge Condition: Stable Ambulatory Status:  Walker Discharge Destination: Home Transportation: Private Auto grd Accompanied By: dtr/caregiver Schedule Follow-up Appointment: No Medication Reconciliation completed No and provided to Patient/Care Rajni Holsworth: Clinical Summary of Care: Electronic Signature(s) Signed: 03/01/2016 4:29:56 PM By: Elpidio Eric BSN, RN Previous Signature: 03/01/2016 1:35:24 PM Version By: Gwenlyn Perking Entered By: Elpidio Eric on 03/01/2016 13:40:21 Terri Barker ([id number]) -------------------------------------------------------------------------------- Lower Extremity Assessment Details Patient Name: Terri Barker Date of Service: 03/01/2016 12:45 PM Medical Record Number: [id number] Patient Account Number: [id number] Date of Birth/Sex: 1927-04-30 (83 y.o. Female) Treating RN: Afful, RN, BSN, Psychologist, clinical Primary Care Dontavia Brand: Clovis Pu, JILL Other Clinician: Referring Cherylanne Ardelean: Clovis Pu, JILL Treating Shelena Castelluccio/Extender: Rudene Re in Treatment: 1 Edema Assessment Assessed: [Left: No] [Right: No] Edema: [Left: Yes] [Right: Yes] Calf Left: Right: Point of Measurement: 32 cm From Medial Instep 31.5 cm 33.2 cm Ankle Left: Right: Point of Measurement: 10 cm From Medial Instep 23.3 cm 24 cm Vascular Assessment Claudication: Claudication Assessment [Left:None] [Right:None] Pulses: Dorsalis Pedis Palpable: [Left:Yes] [Right:Yes] Posterior Tibial Extremity colors, hair growth, and conditions: Extremity Color: [Left:Mottled] [Right:Mottled] Hair Growth on Extremity: [Left:No] [Right:No] Temperature of Extremity: [Left:Warm] [Right:Warm] Capillary Refill: [Left:< 3 seconds] [Right:< 3 seconds] Electronic Signature(s) Signed: 03/01/2016 4:29:56 PM By: Elpidio Eric BSN, RN Entered By: Elpidio Eric on 03/01/2016 12:57:06 Terri Barker ([id number]) -------------------------------------------------------------------------------- Multi Wound Chart Details Patient Name: Terri Barker Date of Service: 03/01/2016  12:45 PM Medical Record Number: [id number] Patient Account Number: [id number] Date of Birth/Sex: 19-Feb-1927 (83 y.o. Female) Treating RN: Afful, RN, BSN, [place] Sink Primary Care Vartan Kerins: Clovis Pu, JILL Other Clinician: Referring Coree Brame: Clovis Pu, JILL Treating Maelin Kurkowski/Extender: Rudene Re in Treatment: 1 Vital Signs Height(in): 63 Pulse(bpm): 99 Weight(lbs): 122 Blood Pressure 152/70 (mmHg): Body Mass Index(BMI): 22 Temperature(F): 98 Respiratory Rate 16 (breaths/min): Photos: [1:No Photos] [2:No Photos] [3:No Photos] Wound Location: [1:Right Lower Leg - Anterior Right Lower Leg - Lateral] [3:Right Lower Leg - Medial] Wounding Event: [1:Gradually Appeared] [2:Gradually Appeared] [3:Gradually Appeared] 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3] (cm) Area (cm) : [1:3.377] [2:1.571] [3:0.707] Volume (cm) : [1:0.675] [2:0.314] [3:0.212] % Reduction in Area: [1:-115.00%] [2:-66.80%] [3:24.90%] % Reduction in Volume: -115.00% [2:-234.00%] [3:25.10%] Classification: [1:Grade 1] [2:Full Thickness Without Exposed Support Structures] [3:Grade 1] HBO Classification: [1:N/A] [2:Grade 2] [3:N/A] Exudate Amount: [1:Large] [2:Large] [3:Large] Exudate Type: [1:Serous] [2:N/A] [3:Serous] Exudate Color: [1:amber] [2:N/A] [3:amber] Wound Margin: [1:Flat and  Intact] [2:Flat and Intact] [3:Flat and Intact] Granulation Amount: [1:None Present (0%)] [2:Small (1-33%)] [3:None Present (0%)] Granulation Quality: [1:N/A] [2:Red] [3:N/A] Necrotic Amount: Large (67-100%) Large (67-100%) Large (67-100%) Necrotic Tissue: Eschar, Adherent Slough Adherent Colgate-PalmoliveSlough Adherent Slough Exposed Structures: Fat Layer (Subcutaneous Fat Layer (Subcutaneous Fat Layer (Subcutaneous Tissue) Exposed: Yes Tissue) Exposed: Yes Tissue) Exposed: Yes Fascia: No Fascia: No Fascia: No Tendon: No Tendon: No Tendon: No Muscle: No Muscle: No Muscle: No Joint: No Joint: No Joint: No Bone: No Bone: No Bone: No Epithelialization: None None None Debridement: N/A N/A Debridement ([id number] 11047) Pre-procedure N/A N/A 13:21 Verification/Time Out Taken: Pain Control: N/A N/A Lidocaine 4% Topical Solution Tissue Debrided: N/A N/A Fibrin/Slough, Subcutaneous Level: N/A N/A Skin/Subcutaneous Tissue Debridement Area (sq N/A N/A 0.9 cm): Instrument: N/A N/A Curette Bleeding: N/A N/A Minimum Hemostasis Achieved: N/A N/A Pressure Procedural Pain: N/A N/A 3 Post Procedural Pain: N/A N/A 3 Debridement Treatment N/A N/A Procedure was tolerated Response: well Post Debridement N/A N/A 1x0.9x0.3 Measurements L x W x D (cm) Post Debridement N/A N/A 0.212 Volume: (cm) Periwound Skin Texture: No Abnormalities Noted Excoriation: No Excoriation: Yes Induration: No Induration: No Callus: No Callus: No Crepitus: No Crepitus: No Rash: No Rash: No Scarring: No Scarring: No Periwound Skin Maceration: Yes Maceration: No Maceration: No Moisture: Dry/Scaly: No Dry/Scaly: No Periwound Skin Color: Mottled: Yes Mottled: Yes Mottled: Yes Atrophie Blanche: No Atrophie Blanche: No Cyanosis: No Cyanosis: No Ecchymosis: No Ecchymosis: No Erythema: No Erythema: No Hemosiderin Staining: No Hemosiderin Staining: No Terri RouteBARKER, Lashan ([id number]) Pallor: No Pallor:  No Rubor: No Rubor: No Temperature: No Abnormality No Abnormality No Abnormality Tenderness on Yes Yes Yes Palpation: Wound Preparation: Ulcer Cleansing: Ulcer Cleansing: Ulcer Cleansing: Rinsed/Irrigated with Rinsed/Irrigated with Rinsed/Irrigated with Saline Saline Saline Topical Anesthetic Topical Anesthetic Topical Anesthetic Applied: Other: lidocaine Applied: Other: Lidocaine Applied: Other: Lidocaine 4% 4% 4% Procedures Performed: N/A N/A Debridement Wound Number: [id number]  Photos: No Photos No Photos No Photos Wound Location: Right Lower Leg - Left Lower Leg Right Lower Leg Posterior Wounding Event: Trauma Trauma Trauma 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 the Lower Extremity the Lower Extremity Secondary Etiology: N/A N/A N/A Comorbid History: Cataracts, Arrhythmia, Cataracts, Arrhythmia, Cataracts, Arrhythmia, Hypertension, Type II Hypertension, Type II Hypertension, Type II Diabetes, Osteoarthritis, Diabetes, Osteoarthritis, Diabetes, Osteoarthritis, Dementia Dementia Dementia Date Acquired: 01/09/2016 01/09/2016 01/09/2016 Weeks of Treatment: 1 1 1  Wound Status: Open Healed - Epithelialized Open Measurements L x W x D 6x3.5x0.4 0x0x0 2.5x2.5x0.3 (cm) Area (cm) : 16.493 0 4.909 Volume (cm) : 6.597 0 1.473 % Reduction in Area: -536.30% 100.00% -150.10% % Reduction in Volume: -536.20% 100.00% -651.50% Classification: Grade 1 Grade 0  Grade 1 HBO Classification: N/A N/A N/A Exudate Amount: Large None Present Large Exudate Type: Serous N/A Serous Exudate Color: amber N/A amber Wound Margin: Flat and Intact Flat and Intact Flat and Intact Granulation Amount: None Present (0%) None Present (0%) None Present (0%) Granulation Quality: N/A N/A N/A Necrotic Amount: Large (67-100%) None Present (0%) Large (67-100%) Necrotic Tissue: Adherent Slough N/A Eschar Exposed Structures: Fat Layer  (Subcutaneous Fascia: No Fat Layer (Subcutaneous Tissue) Exposed: Yes Fat Layer (Subcutaneous Tissue) Exposed: Yes Fascia: No Tissue) Exposed: No Fascia: No Tendon: No Tendon: No Tendon: No KYLEENA, SCHEIRER ([id number]) Muscle: No Muscle: No Muscle: No Joint: No Joint: No Joint: No Bone: No Bone: No Bone: No Limited to Skin Breakdown Epithelialization: None Large (67-100%) None Debridement: Debridement (40102- N/A N/A 11047) Pre-procedure 13:22 N/A N/A Verification/Time Out Taken: Pain Control: Lidocaine 4% Topical N/A N/A Solution Tissue Debrided: Fibrin/Slough, N/A N/A Subcutaneous Level: Skin/Subcutaneous N/A N/A Tissue Debridement Area (sq 21 N/A N/A cm): Instrument: Curette N/A N/A Bleeding: Minimum N/A N/A Hemostasis Achieved: Pressure N/A N/A Procedural Pain: 3 N/A N/A Post Procedural Pain: 3 N/A N/A Debridement Treatment Procedure was tolerated N/A N/A Response: well Post Debridement 6x3.5x0.4 N/A N/A Measurements L x W x D (cm) Post Debridement 6.597 N/A N/A Volume: (cm) Periwound Skin Texture: Excoriation: No Excoriation: No Excoriation: No Induration: No Induration: No Induration: No Callus: No Callus: No Callus: No Crepitus: No Crepitus: No Crepitus: No Rash: No Rash: No Rash: No Scarring: No Scarring: No Scarring: No Periwound Skin Maceration: No Dry/Scaly: Yes Maceration: No Moisture: Dry/Scaly: No Maceration: No Dry/Scaly: No Periwound Skin Color: Mottled: Yes Atrophie Blanche: No Mottled: Yes Atrophie Blanche: No Cyanosis: No Atrophie Blanche: No Cyanosis: No Ecchymosis: No Cyanosis: No Ecchymosis: No Erythema: No Ecchymosis: No Erythema: No Hemosiderin Staining: No Erythema: No Hemosiderin Staining: No Mottled: No Hemosiderin Staining: No Pallor: No Pallor: No Pallor: No Rubor: No Rubor: No Rubor: No Temperature: No Abnormality No Abnormality No Abnormality Tenderness on No No Yes Palpation: KITIARA, HINTZE  ([id number]) Wound Preparation: Ulcer Cleansing: Ulcer Cleansing: Not Ulcer Cleansing: Rinsed/Irrigated with Cleansed: dried Rinsed/Irrigated with Saline Saline Topical Anesthetic Topical Anesthetic Applied: None Topical Anesthetic Applied: Other: Lidocaine Applied: Other: lidocaine 4% 4% Procedures Performed: Debridement N/A N/A Treatment Notes Wound #1 (Right, Anterior Lower Leg) 1. Cleansed with: Clean wound with Normal Saline Cleanse wound with antibacterial soap and water 2. Anesthetic Topical Lidocaine 4% cream to wound bed prior to debridement 4. Dressing Applied: Santyl Ointment 5. Secondary Dressing Applied ABD and Kerlix/Conform 7. Secured with Tape Wound #2 (Right, Lateral Lower Leg) 1. Cleansed with: Clean wound with Normal Saline Cleanse wound with antibacterial soap and water 2. Anesthetic Topical Lidocaine 4% cream to wound bed prior to debridement 4. Dressing Applied: Santyl Ointment 5. Secondary Dressing Applied ABD and Kerlix/Conform 7. Secured with Tape Wound #3 (Right, Medial Lower Leg) 1. Cleansed with: Clean wound with Normal Saline Cleanse wound with antibacterial soap and water 2. Anesthetic Topical Lidocaine 4% cream to wound bed prior to debridement 4. Dressing Applied: Santyl Ointment 5. Secondary Dressing Applied ABD and Kerlix/Conform 7. Secured with Terri Barker ([id number]) Tape Wound #4 (Right, Posterior Lower Leg) 1. Cleansed with: Clean wound with Normal Saline Cleanse wound with antibacterial soap and water 2. Anesthetic Topical Lidocaine 4% cream to wound bed prior to debridement 4. Dressing Applied: Santyl Ointment 5. Secondary Dressing Applied ABD and Kerlix/Conform 7. Secured with Tape Wound #6 (Right Lower Leg) 1. Cleansed with: Clean wound with Normal Saline Cleanse  wound with antibacterial soap and water 2. Anesthetic Topical Lidocaine 4% cream to wound bed prior to debridement 4. Dressing Applied: Santyl  Ointment 5. Secondary Dressing Applied ABD and Kerlix/Conform 7. Secured with Secretary/administrator) Signed: 03/01/2016 1:29:37 PM By: Evlyn Kanner MD, FACS Entered By: Evlyn Kanner on 03/01/2016 13:29:37 Terri Barker ([id number]) -------------------------------------------------------------------------------- Multi-Disciplinary Care Plan Details Patient Name: Terri Barker Date of Service: 03/01/2016 12:45 PM Medical Record Number: [id number] Patient Account Number: [id number] Date of Birth/Sex: 19-Jan-1927 (83 y.o. Female) Treating RN: Clover Mealy, RN, BSN, [place] Sink Primary Care Renezmae Canlas: Clovis Pu, JILL Other Clinician: Referring Bravery Ketcham: Clovis Pu, JILL Treating Paden Kuras/Extender: Rudene Re in Treatment: 1 Active Inactive Electronic Signature(s) Signed: 03/27/2016 2:30:51 PM By: Elliot Gurney RN, BSN, Kim RN, BSN Signed: 05/08/2016 4:24:13 PM By: Elpidio Eric BSN, RN Previous Signature: 03/01/2016 4:29:56 PM Version By: Elpidio Eric BSN, RN Entered By: Elliot Gurney, RN, BSN, Kim on 03/27/2016 14:30:50 Terri Barker ([id number]) -------------------------------------------------------------------------------- Pain Assessment Details Patient Name: Terri Barker Date of Service: 03/01/2016 12:45 PM Medical Record Number: [id number] Patient Account Number: [id number] Date of Birth/Sex: 1927-05-14 (83 y.o. Female) Treating RN: Afful, RN, BSN, [place] Sink Primary Care Braycen Burandt: Clovis Pu, JILL Other Clinician: Referring Regis Wiland: Clovis Pu, JILL Treating Christiano Blandon/Extender: Rudene Re in Treatment: 1 Active Problems Location of Pain Severity and Description of Pain Patient Has Paino No Site Locations With Dressing Change: No Pain Management and Medication Current Pain Management: Electronic Signature(s) Signed: 03/01/2016 4:29:56 PM By: Elpidio Eric BSN, RN Entered By: Elpidio Eric on 03/01/2016 12:55:20 Terri Barker  ([id number]) -------------------------------------------------------------------------------- Patient/Caregiver Education Details Patient Name: Terri Barker Date of Service: 03/01/2016 12:45 PM Medical Record Number: [id number] Patient Account Number: [id number] Date of Birth/Gender: 03-12-1927 (83 y.o. Female) Treating RN: Afful, RN, BSN, [place] Sink Primary Care Physician: Clovis Pu, JILL Other Clinician: Referring Physician: Clovis Pu, JILL Treating Physician/Extender: Rudene Re in Treatment: 1 Education Assessment Education Provided To: Patient Education Topics Provided Elevated Blood Sugar/ Impact on Healing: Methods: Explain/Verbal Responses: State content correctly Safety: Methods: Explain/Verbal Responses: State content correctly [facility]: Methods: Explain/Verbal Responses: State content correctly Wound Debridement: Methods: Explain/Verbal Responses: State content correctly Wound/Skin Impairment: Methods: Explain/Verbal Responses: State content correctly Electronic Signature(s) Signed: 03/01/2016 4:29:56 PM By: Elpidio Eric BSN, RN Entered By: Elpidio Eric on 03/01/2016 13:40:47 Terri Barker ([id number]) -------------------------------------------------------------------------------- Wound Assessment Details Patient Name: Terri Barker Date of Service: 03/01/2016 12:45 PM Medical Record Number: [id number] Patient Account Number: [id number] Date of Birth/Sex: [birth year] (83 y.o. Female) Treating RN: Afful, RN, BSN, [place] Sink Primary Care Jantzen Pilger: Clovis Pu, JILL Other Clinician: Referring Meaghan Whistler: Clovis Pu, JILL Treating Jay Kempe/Extender: Rudene Re in Treatment: 1 Wound Status Wound Number: 1 Primary Diabetic Wound/Ulcer of the Lower Etiology: Extremity Wound Location: Right Lower Leg - Anterior Wound Open Wounding Event: Gradually Appeared Status: Date Acquired: 01/09/2016 Comorbid Cataracts, Arrhythmia, Hypertension, Weeks Of  Treatment: 1 History: Type II Diabetes, Osteoarthritis, Clustered Wound: No Dementia Photos Photo Uploaded By: Elpidio Eric on 03/01/2016 17:42:25 Wound Measurements Length: (cm) 4.3 Width: (cm) 1 Depth: (cm) 0.2 Area: (cm) 3.377 Volume: (cm) 0.675 % Reduction in Area: -115% % Reduction in Volume: -115% Epithelialization: None Tunneling: No Undermining: No Wound Description Classification: Grade 1 Wound Margin: Flat and Intact Exudate Amount: Large Exudate Type: Serous Exudate Color: amber Foul Odor After Cleansing: No Slough/Fibrino Yes Wound Bed Granulation Amount: None Present (0%) Exposed Structure Necrotic Amount: Large (67-100%) Fascia Exposed: No Necrotic Quality: Eschar, Adherent Slough Fat Layer (Subcutaneous Tissue) Exposed: Yes Tendon Exposed: No  BRENLEY, PRIORE ([id number]) Muscle Exposed: No Joint Exposed: No Bone Exposed: No Periwound Skin Texture Texture Color No Abnormalities Noted: No No Abnormalities Noted: No Mottled: Yes Moisture No Abnormalities Noted: No Temperature / Pain Maceration: Yes Temperature: No Abnormality Tenderness on Palpation: Yes Wound Preparation Ulcer Cleansing: Rinsed/Irrigated with Saline Topical Anesthetic Applied: Other: lidocaine 4%, Electronic Signature(s) Signed: 03/01/2016 4:29:56 PM By: Elpidio Eric BSN, RN Entered By: Elpidio Eric on 03/01/2016 13:09:30 Terri Barker ([id number]) -------------------------------------------------------------------------------- Wound Assessment Details Patient Name: Terri Barker Date of Service: 03/01/2016 12:45 PM Medical Record Number: [id number] Patient Account Number: [id number] Date of Birth/Sex: [birth year] (83 y.o. Female) Treating RN: Afful, RN, BSN, [employer] Sink Primary Care Blythe Hartshorn: Clovis Pu, JILL Other Clinician: Referring Shemar Plemmons: Clovis Pu, JILL Treating Analia Zuk/Extender: Rudene Re in Treatment: 1 Wound Status Wound Number: 2 Primary Dehisced  Wound Etiology: Wound Location: Right Lower Leg - Lateral Secondary Diabetic Wound/Ulcer of the Lower Wounding Event: Gradually Appeared Etiology: Extremity Date Acquired: 01/09/2016 Wound Open Weeks Of Treatment: 1 Status: Clustered Wound: No Comorbid Cataracts, Arrhythmia, Hypertension, History: Type II Diabetes, Osteoarthritis, Dementia Photos Photo Uploaded By: Elpidio Eric on 03/01/2016 17:44:06 Wound Measurements Length: (cm) 1 Width: (cm) 2 Depth: (cm) 0.2 Area: (cm) 1.571 Volume: (cm) 0.314 % Reduction in Area: -66.8% % Reduction in Volume: -234% Epithelialization: None Tunneling: No Undermining: No Wound Description Full Thickness Without Classification: Exposed Support Structures Diabetic Severity Grade 2 (Wagner): Wound Margin: Flat and Intact Exudate Amount: Large Foul Odor After Cleansing: No Slough/Fibrino Yes Wound Bed Granulation Amount: Small (1-33%) Exposed Structure CHARLIE, CHAR ([id number]) Granulation Quality: Red Fascia Exposed: No Necrotic Amount: Large (67-100%) Fat Layer (Subcutaneous Tissue) Exposed: Yes Necrotic Quality: Adherent Slough Tendon Exposed: No Muscle Exposed: No Joint Exposed: No Bone Exposed: No Periwound Skin Texture Texture Color No Abnormalities Noted: No No Abnormalities Noted: No Callus: No Atrophie Blanche: No Crepitus: No Cyanosis: No Excoriation: No Ecchymosis: No Induration: No Erythema: No Rash: No Hemosiderin Staining: No Scarring: No Mottled: Yes Pallor: No Moisture Rubor: No No Abnormalities Noted: No Dry / Scaly: No Temperature / Pain Maceration: No Temperature: No Abnormality Tenderness on Palpation: Yes Wound Preparation Ulcer Cleansing: Rinsed/Irrigated with Saline Topical Anesthetic Applied: Other: Lidocaine 4%, Electronic Signature(s) Signed: 03/01/2016 1:12:44 PM By: Elpidio Eric BSN, RN Entered By: Elpidio Eric on 03/01/2016 13:12:44 Terri Barker  ([id number]) -------------------------------------------------------------------------------- Wound Assessment Details Patient Name: Terri Barker Date of Service: 03/01/2016 12:45 PM Medical Record Number: [id number] Patient Account Number: [id number] Date of Birth/Sex: 10/17/1927 (83 y.o. Female) Treating RN: Afful, RN, BSN, [place] Sink Primary Care Polina Burmaster: Clovis Pu, JILL Other Clinician: Referring Roylene Heaton: Clovis Pu, JILL Treating Karrina Lye/Extender: Rudene Re in Treatment: 1 Wound Status Wound Number: 3 Primary Diabetic Wound/Ulcer of the Lower Etiology: Extremity Wound Location: Right Lower Leg - Medial Wound Open Wounding Event: Gradually Appeared Status: Date Acquired: 01/09/2016 Comorbid Cataracts, Arrhythmia, Hypertension, Weeks Of Treatment: 1 History: Type II Diabetes, Osteoarthritis, Clustered Wound: No Dementia Photos Photo Uploaded By: Elpidio Eric on 03/01/2016 17:44:06 Wound Measurements Length: (cm) 1 Width: (cm) 0.9 Depth: (cm) 0.3 Area: (cm) 0.707 Volume: (cm) 0.212 % Reduction in Area: 24.9% % Reduction in Volume: 25.1% Epithelialization: None Tunneling: No Undermining: No Wound Description Classification: Grade 1 Wound Margin: Flat and Intact Exudate Amount: Large Exudate Type: Serous ZAMERIA, VOGL ([id number]) Foul Odor After Cleansing: No Slough/Fibrino Yes Exudate Color: amber Wound Bed Granulation Amount: None Present (0%) Exposed Structure Necrotic Amount: Large (67-100%) Fascia Exposed: No Necrotic Quality: Adherent Slough Fat Layer (Subcutaneous Tissue) Exposed: Yes Tendon  Exposed: No Muscle Exposed: No Joint Exposed: No Bone Exposed: No Periwound Skin Texture Texture Color No Abnormalities Noted: No No Abnormalities Noted: No Callus: No Atrophie Blanche: No Crepitus: No Cyanosis: No Excoriation: Yes Ecchymosis: No Induration: No Erythema: No Rash: No Hemosiderin Staining: No Scarring: No Mottled: Yes Pallor:  No Moisture Rubor: No No Abnormalities Noted: No Dry / Scaly: No Temperature / Pain Maceration: No Temperature: No Abnormality Tenderness on Palpation: Yes Wound Preparation Ulcer Cleansing: Rinsed/Irrigated with Saline Topical Anesthetic Applied: Other: Lidocaine 4%, Electronic Signature(s) Signed: 03/01/2016 1:13:51 PM By: Elpidio Eric BSN, RN Previous Signature: 03/01/2016 1:13:39 PM Version By: Elpidio Eric BSN, RN Entered By: Elpidio Eric on 03/01/2016 13:13:51 Terri Barker ([id number]) -------------------------------------------------------------------------------- Wound Assessment Details Patient Name: Terri Barker Date of Service: 03/01/2016 12:45 PM Medical Record Number: [id number] Patient Account Number: [id number] Date of Birth/Sex: 08-Jul-1927 (83 y.o. Female) Treating RN: Afful, RN, BSN, [place] Sink Primary Care Evalene Vath: Clovis Pu, JILL Other Clinician: Referring Kenishia Plack: Clovis Pu, JILL Treating Venicia Vandall/Extender: Rudene Re in Treatment: 1 Wound Status Wound Number: 4 Primary Diabetic Wound/Ulcer of the Lower Etiology: Extremity Wound Location: Right Lower Leg - Posterior Wound Open Wounding Event: Trauma Status: Date Acquired: 01/09/2016 Comorbid Cataracts, Arrhythmia, Hypertension, Weeks Of Treatment: 1 History: Type II Diabetes, Osteoarthritis, Clustered Wound: No Dementia Photos Photo Uploaded By: Elpidio Eric on 03/01/2016 17:44:41 Wound Measurements Length: (cm) 6 Width: (cm) 3.5 Depth: (cm) 0.4 Area: (cm) 16.493 Volume: (cm) 6.597 % Reduction in Area: -536.3% % Reduction in Volume: -536.2% Epithelialization: None Tunneling: No Undermining: No Wound Description Classification: Grade 1 Wound Margin: Flat and Intact Exudate Amount: Large Exudate Type: Serous NA, WALDRIP ([id number]) Foul Odor After Cleansing: No Slough/Fibrino Yes Exudate Color: amber Wound Bed Granulation Amount: None Present (0%) Exposed Structure Necrotic Amount: Large  (67-100%) Fascia Exposed: No Necrotic Quality: Adherent Slough Fat Layer (Subcutaneous Tissue) Exposed: Yes Tendon Exposed: No Muscle Exposed: No Joint Exposed: No Bone Exposed: No Periwound Skin Texture Texture Color No Abnormalities Noted: No No Abnormalities Noted: No Callus: No Atrophie Blanche: No Crepitus: No Cyanosis: No Excoriation: No Ecchymosis: No Induration: No Erythema: No Rash: No Hemosiderin Staining: No Scarring: No Mottled: Yes Pallor: No Moisture Rubor: No No Abnormalities Noted: No Dry / Scaly: No Temperature / Pain Maceration: No Temperature: No Abnormality Wound Preparation Ulcer Cleansing: Rinsed/Irrigated with Saline Topical Anesthetic Applied: Other: Lidocaine 4%, Electronic Signature(s) Signed: 03/01/2016 1:14:34 PM By: Elpidio Eric BSN, RN Entered By: Elpidio Eric on 03/01/2016 13:14:34 Terri Barker ([id number]) -------------------------------------------------------------------------------- Wound Assessment Details Patient Name: Terri Barker Date of Service: 03/01/2016 12:45 PM Medical Record Number: [id number] Patient Account Number: [id number] Date of Birth/Sex: 1927-06-18 (83 y.o. Female) Treating RN: Afful, RN, BSN, [place] [facility] Primary Care Albertus Chiarelli: Clovis Pu, JILL Other Clinician: Referring Signa Cheek: Clovis Pu, JILL Treating Zarinah Oviatt/Extender: Rudene Re in Treatment: 1 Wound Status Wound Number: 5 Primary Diabetic Wound/Ulcer of the Lower Etiology: Extremity Wound Location: Left Lower Leg Wound Healed - Epithelialized Wounding Event: Trauma Status: Date Acquired: 01/09/2016 Comorbid Cataracts, Arrhythmia, Hypertension, Weeks Of Treatment: 1 History: Type II Diabetes, Osteoarthritis, Clustered Wound: No Dementia Photos Photo Uploaded By: Elpidio Eric on 03/01/2016 17:44:42 Wound Measurements Length: (cm) 0 % Reduction i Width: (cm) 0 % Reduction i Depth: (cm) 0 Epithelializa Area: (cm) 0 Tunneling: Volume: (cm)  0 Undermining: n Area: 100% n Volume: 100% tion: Large (67-100%) No No Wound Description Classification: Grade 0 Foul Odor Aft Wound Margin: Flat and Intact Slough/Fibrin Exudate Amount: None Present MAYLYN, NARVAIZ ([id number]) er Cleansing: No  o No Wound Bed Granulation Amount: None Present (0%) Exposed Structure Necrotic Amount: None Present (0%) Fascia Exposed: No Fat Layer (Subcutaneous Tissue) Exposed: No Tendon Exposed: No Muscle Exposed: No Joint Exposed: No Bone Exposed: No Limited to Skin Breakdown Periwound Skin Texture Texture Color No Abnormalities Noted: No No Abnormalities Noted: No Callus: No Atrophie Blanche: No Crepitus: No Cyanosis: No Excoriation: No Ecchymosis: No Induration: No Erythema: No Rash: No Hemosiderin Staining: No Scarring: No Mottled: No Pallor: No Moisture Rubor: No No Abnormalities Noted: No Dry / Scaly: Yes Temperature / Pain Maceration: No Temperature: No Abnormality Wound Preparation Ulcer Cleansing: Not Cleansed: dried, Topical Anesthetic Applied: None Electronic Signature(s) Signed: 03/01/2016 1:15:33 PM By: Elpidio Eric BSN, RN Entered By: Elpidio Eric on 03/01/2016 13:15:33 Terri Barker ([id number]) -------------------------------------------------------------------------------- Wound Assessment Details Patient Name: Terri Barker Date of Service: 03/01/2016 12:45 PM Medical Record Number: [id number] Patient Account Number: [id number] Date of Birth/Sex: 11-17-1927 (83 y.o. Female) Treating RN: Afful, RN, BSN, [place] [facility] Primary Care Berdine Rasmusson: Clovis Pu, JILL Other Clinician: Referring Orson Rho: Clovis Pu, JILL Treating Timothee Gali/Extender: Rudene Re in Treatment: 1 Wound Status Wound Number: 6 Primary Diabetic Wound/Ulcer of the Lower Etiology: Extremity Wound Location: Right Lower Leg Wound Open Wounding Event: Trauma Status: Date Acquired: 01/09/2016 Comorbid Cataracts, Arrhythmia, Hypertension, Weeks Of  Treatment: 1 History: Type II Diabetes, Osteoarthritis, Clustered Wound: No Dementia Wound Measurements Length: (cm) 2.5 Width: (cm) 2.5 Depth: (cm) 0.3 Area: (cm) 4.909 Volume: (cm) 1.473 % Reduction in Area: -150.1% % Reduction in Volume: -651.5% Epithelialization: None Tunneling: No Undermining: No Wound Description Classification: Grade 1 Wound Margin: Flat and Intact Exudate Amount: Large Exudate Type: Serous Exudate Color: amber Foul Odor After Cleansing: No Slough/Fibrino Yes Wound Bed Granulation Amount: None Present (0%) Exposed Structure Necrotic Amount: Large (67-100%) Fascia Exposed: No Necrotic Quality: Eschar Fat Layer (Subcutaneous Tissue) Exposed: Yes Tendon Exposed: No Muscle Exposed: No Joint Exposed: No Bone Exposed: No Periwound Skin Texture Texture Color No Abnormalities Noted: No No Abnormalities Noted: No Callus: No Atrophie Blanche: No Crepitus: No Cyanosis: No Excoriation: No Ecchymosis: No Induration: No Erythema: No MARCHETTA, NAVRATIL ([id number]) Rash: No Hemosiderin Staining: No Scarring: No Mottled: Yes Pallor: No Moisture Rubor: No No Abnormalities Noted: No Dry / Scaly: No Temperature / Pain Maceration: No Temperature: No Abnormality Tenderness on Palpation: Yes Wound Preparation Ulcer Cleansing: Rinsed/Irrigated with Saline Topical Anesthetic Applied: Other: lidocaine 4%, Electronic Signature(s) Signed: 03/01/2016 1:16:21 PM By: Elpidio Eric BSN, RN Entered By: Elpidio Eric on 03/01/2016 13:16:20 Terri Barker ([id number]) -------------------------------------------------------------------------------- Vitals Details Patient Name: Terri Barker Date of Service: 03/01/2016 12:45 PM Medical Record Number: [id number] Patient Account Number: [id number] Date of Birth/Sex: Aug 17, 1927 (83 y.o. Female) Treating RN: Afful, RN, BSN, [place] [facility] Primary Care Jasn Xia: Clovis Pu, JILL Other Clinician: Referring Kirtis Challis: Clovis Pu,  JILL Treating Kylle Lall/Extender: Rudene Re in Treatment: 1 Vital Signs Time Taken: 12:55 Temperature (F): 98 Height (in): 63 Pulse (bpm): 99 Weight (lbs): 122 Respiratory Rate (breaths/min): 16 Body Mass Index (BMI): 21.6 Blood Pressure (mmHg): 152/70 Reference Range: 80 - 120 mg / dl Electronic Signature(s) Signed: 03/01/2016 4:29:56 PM By: Elpidio Eric BSN, RN Entered By: Elpidio Eric on 03/01/2016 12:57:30

## 2016-03-02 NOTE — Progress Notes (Signed)
NAAMAH, BOGGESS ([id number]) Visit Report for 03/01/2016 Chief Complaint Document Details Patient Name: Terri Barker, Terri Barker Date of Service: 03/01/2016 12:45 PM Medical Record Number: [id number] Patient Account Number: [id number] Date of Birth/Sex: 1927-04-18 (83 y.o. Female) Treating RN: Afful, RN, BSN, [place] Sink Primary Care Provider: Clovis Pu, JILL Other Clinician: Referring Provider: Clovis Pu, JILL Treating Provider/Extender: Rudene Re in Treatment: 1 Information Obtained from: Patient Chief Complaint Patient seen for complaints of Non-Healing Wound to the right lower extremity caused by multiple falls due to being unsteady. These have been there over the last month Electronic Signature(s) Signed: 03/01/2016 1:45:17 PM By: Evlyn Kanner MD, FACS Entered By: Evlyn Kanner on 03/01/2016 13:45:17 Terri Barker ([id number]) -------------------------------------------------------------------------------- Debridement Details Patient Name: Terri Barker Date of Service: 03/01/2016 12:45 PM Medical Record Number: [id number] Patient Account Number: [id number] Date of Birth/Sex: 10/15/1927 (83 y.o. Female) Treating RN: Afful, RN, BSN, [place] Sink Primary Care Provider: Clovis Pu, JILL Other Clinician: Referring Provider: Clovis Pu, JILL Treating Provider/Extender: Rudene Re in Treatment: 1 Debridement Performed for Wound #3 Right,Medial Lower Leg Assessment: Performed By: Physician Evlyn Kanner, MD Debridement: Debridement Pre-procedure Yes - 13:21 Verification/Time Out Taken: Start Time: 13:21 Pain Control: Lidocaine 4% Topical Solution Level: Skin/Subcutaneous Tissue Total Area Debrided (L x 1 (cm) x 0.9 (cm) = 0.9 (cm) W): Tissue and other Viable, Non-Viable, Fibrin/Slough, Subcutaneous material debrided: Instrument: Curette Bleeding: Minimum Hemostasis Achieved: Pressure End Time: 13:22 Procedural Pain: 3 Post Procedural Pain: 3 Response to Treatment: Procedure was tolerated  well Post Debridement Measurements of Total Wound Length: (cm) 1 Width: (cm) 0.9 Depth: (cm) 0.3 Volume: (cm) 0.212 Character of Wound/Ulcer Post Requires Further Debridement Debridement: Severity of Tissue Post Debridement: Fat layer exposed Post Procedure Diagnosis Same as Pre-procedure Electronic Signature(s) Signed: 03/01/2016 1:30:16 PM By: Evlyn Kanner MD, FACS Signed: 03/01/2016 4:29:56 PM By: Elpidio Eric BSN, RN Entered By: Evlyn Kanner on 03/01/2016 13:30:16 Terri Barker ([id number]) Terri Barker, Terri Barker ([id number]) -------------------------------------------------------------------------------- Debridement Details Patient Name: Terri Barker Date of Service: 03/01/2016 12:45 PM Medical Record Number: [id number] Patient Account Number: [id number] Date of Birth/Sex: [birth year] (83 y.o. Female) Treating RN: Afful, RN, BSN, [place] Sink Primary Care Provider: Clovis Pu, JILL Other Clinician: Referring Provider: Clovis Pu, JILL Treating Provider/Extender: Rudene Re in Treatment: 1 Debridement Performed for Wound #4 Right,Posterior Lower Leg Assessment: Performed By: Physician Evlyn Kanner, MD Debridement: Debridement Pre-procedure Yes - 13:22 Verification/Time Out Taken: Start Time: 13:22 Pain Control: Lidocaine 4% Topical Solution Level: Skin/Subcutaneous Tissue Total Area Debrided (L x 6 (cm) x 3.5 (cm) = 21 (cm) W): Tissue and other Non-Viable, Fibrin/Slough, Subcutaneous material debrided: Instrument: Curette Bleeding: Minimum Hemostasis Achieved: Pressure End Time: 13:22 Procedural Pain: 3 Post Procedural Pain: 3 Response to Treatment: Procedure was tolerated well Post Debridement Measurements of Total Wound Length: (cm) 6 Width: (cm) 3.5 Depth: (cm) 0.4 Volume: (cm) 6.597 Character of Wound/Ulcer Post Requires Further Debridement Debridement: Severity of Tissue Post Debridement: Fat layer exposed Post Procedure Diagnosis Same as  Pre-procedure Electronic Signature(s) Signed: 03/01/2016 1:30:36 PM By: Evlyn Kanner MD, FACS Signed: 03/01/2016 4:29:56 PM By: Elpidio Eric BSN, RN Entered By: Evlyn Kanner on 03/01/2016 13:30:36 Terri Barker ([id number]) Terri Barker, Terri Barker ([id number]) -------------------------------------------------------------------------------- Debridement Details Patient Name: Terri Barker Date of Service: 03/01/2016 12:45 PM Medical Record Number: [id number] Patient Account Number: [id number] Date of Birth/Sex: [birth year] (83 y.o. Female) Treating RN: Clover Mealy, RN, BSN, [place] Sink Primary Care Provider: Clovis Pu, JILL Other Clinician: Referring Provider: Clovis Pu, JILL Treating Provider/Extender: Rudene Re in Treatment: 1 Debridement Performed for Wound #  6 Right Lower Leg Assessment: Performed By: Physician Evlyn Kanner, MD Debridement: Debridement Pre-procedure Yes - 13:21 Verification/Time Out Taken: Start Time: 13:21 Pain Control: Lidocaine 4% Topical Solution Level: Skin/Subcutaneous Tissue Total Area Debrided (L x 2.5 (cm) x 2.5 (cm) = 6.25 (cm) W): Tissue and other Viable, Non-Viable, Fibrin/Slough, Subcutaneous material debrided: Instrument: Curette Bleeding: Minimum Hemostasis Achieved: Pressure End Time: 13:22 Procedural Pain: 3 Post Procedural Pain: 3 Response to Treatment: Procedure was tolerated well Post Debridement Measurements of Total Wound Length: (cm) 2.5 Width: (cm) 2.5 Depth: (cm) 0.3 Volume: (cm) 1.473 Character of Wound/Ulcer Post Requires Further Debridement Debridement: Severity of Tissue Post Debridement: Fat layer exposed Post Procedure Diagnosis Same as Pre-procedure Electronic Signature(s) Signed: 03/01/2016 1:31:36 PM By: Evlyn Kanner MD, FACS Signed: 03/01/2016 4:29:56 PM By: Elpidio Eric BSN, RN Entered By: Evlyn Kanner on 03/01/2016 13:31:36 Terri Barker ([id number]) Terri Barker, Terri Barker  ([id number]) -------------------------------------------------------------------------------- Debridement Details Patient Name: Terri Barker Date of Service: 03/01/2016 12:45 PM Medical Record Number: [id number] Patient Account Number: [id number] Date of Birth/Sex: Oct 09, 1927 (83 y.o. Female) Treating RN: Afful, RN, BSN, [place] Sink Primary Care Provider: Clovis Pu, JILL Other Clinician: Referring Provider: Clovis Pu, JILL Treating Provider/Extender: Rudene Re in Treatment: 1 Debridement Performed for Wound #1 Right,Anterior Lower Leg Assessment: Performed By: Physician Evlyn Kanner, MD Debridement: Debridement Pre-procedure Yes - 13:21 Verification/Time Out Taken: Start Time: 13:21 Pain Control: Lidocaine 4% Topical Solution Level: Skin/Subcutaneous Tissue Total Area Debrided (L x 4.3 (cm) x 1 (cm) = 4.3 (cm) W): Tissue and other Viable, Non-Viable, Fibrin/Slough, Subcutaneous material debrided: Instrument: Curette Bleeding: Minimum Hemostasis Achieved: Pressure End Time: 13:22 Procedural Pain: 3 Post Procedural Pain: 3 Response to Treatment: Procedure was tolerated well Post Debridement Measurements of Total Wound Length: (cm) 4.3 Width: (cm) 1 Depth: (cm) 0.3 Volume: (cm) 1.013 Character of Wound/Ulcer Post Requires Further Debridement Debridement: Severity of Tissue Post Debridement: Fat layer exposed Post Procedure Diagnosis Same as Pre-procedure Electronic Signature(s) Signed: 03/01/2016 1:32:29 PM By: Evlyn Kanner MD, FACS Signed: 03/01/2016 4:29:56 PM By: Elpidio Eric BSN, RN Entered By: Evlyn Kanner on 03/01/2016 13:32:29 Terri Barker, Terri Barker ([id number]) Terri Barker, Terri Barker ([id number]) -------------------------------------------------------------------------------- HPI Details Patient Name: Terri Barker Date of Service: 03/01/2016 12:45 PM Medical Record Number: [id number] Patient Account Number: [id number] Date of Birth/Sex: 1927/10/02 (83 y.o. Female) Treating RN:  Afful, RN, BSN, [place] Sink Primary Care Provider: Clovis Pu, JILL Other Clinician: Referring Provider: Clovis Pu, JILL Treating Provider/Extender: Rudene Re in Treatment: 1 History of Present Illness Location: right lower extremity mainly on the posterior and lateral calf Quality: Patient reports experiencing a dull pain to affected area(s). Severity: Patient states wound are getting worse. Duration: Patient has had the wound for < 4 weeks prior to presenting for treatment Timing: Pain in wound is Intermittent (comes and goes Context: The wound appeared gradually over time Modifying Factors: Other treatment(s) tried include:has been on local treatment with Santyl ointment and also oral doxycycline Associated Signs and Symptoms: Patient reports having increase swelling. HPI Description: 83 year old patient who is in to [place] by the [facility] health care center, has a past medical history of generalized weakness, chronic atrial fibrillation, type 2 diabetes mellitus without complications, hyperlipidemia, benign neoplasm of bone, essential hypertension. She has been under the care of [facility] for a wound on her left shin since the middle of January 2018. the recommendation was to use triple antibiotic ointment and Telfa daily. There was also a stage III pressure wound on the sacrum and this was to be  treated with Santyl ointment. At a later visit the patient was also found to have a wound on her right shin of about 3 weeks' duration. The wound was treated with Santyl ointment. Right lower extremity DVT study was negative. Non-invasive arterial study done showed there was hemodynamically significant arterial sclerotic changes involving the right common femoral artery, proximal right superficial femoral artery, right posterior tibial artery and the right DPA. There was also hemodynamically significant arterial sclerotic disease involving the left popliteal artery. An  attempt was made to review medical records electronically but none were available on the hospital system. Her most recent INR was 3.1 and her last hemoglobin A1c was 6.5 Electronic Signature(s) Signed: 03/01/2016 1:45:25 PM By: Evlyn Kanner MD, FACS Entered By: Evlyn Kanner on 03/01/2016 13:45:25 Terri Barker ([id number]) -------------------------------------------------------------------------------- Physical Exam Details Patient Name: Terri Barker Date of Service: 03/01/2016 12:45 PM Medical Record Number: [id number] Patient Account Number: [id number] Date of Birth/Sex: Sep 15, 1927 (83 y.o. Female) Treating RN: Clover Mealy, RN, BSN, [place] Sink Primary Care Provider: Clovis Pu, JILL Other Clinician: Referring Provider: Clovis Pu, JILL Treating Provider/Extender: Rudene Re in Treatment: 1 Constitutional . Pulse regular. Respirations normal and unlabored. Afebrile. . Eyes Nonicteric. Reactive to light. Ears, Nose, Mouth, and Throat Lips, teeth, and gums WNL.Marland Kitchen Moist mucosa without lesions. Neck supple and nontender. No palpable supraclavicular or cervical adenopathy. Normal sized without goiter. Respiratory WNL. No retractions.. Breath sounds WNL, No rubs, rales, rhonchi, or wheeze.. Cardiovascular Heart rhythm and rate regular, no murmur or gallop.. Pedal Pulses WNL. No clubbing, cyanosis or edema. Chest Breasts symmetical and no nipple discharge.. Breast tissue WNL, no masses, lumps, or tenderness.. Lymphatic No adneopathy. No adenopathy. No adenopathy. Musculoskeletal Adexa without tenderness or enlargement.. Digits and nails w/o clubbing, cyanosis, infection, petechiae, ischemia, or inflammatory conditions.. Integumentary (Hair, Skin) No suspicious lesions. No crepitus or fluctuance. No peri-wound warmth or erythema. No masses.Marland Kitchen Psychiatric Judgement and insight Intact.. No evidence of depression, anxiety, or agitation.. Notes several of the wounds on her right lower extremity on  the posterior calf lateral calf and anterior cuff was sharply debrided with a #3 curet and bleeding controlled with pressure. Electronic Signature(s) Signed: 03/01/2016 1:46:01 PM By: Evlyn Kanner MD, FACS Entered By: Evlyn Kanner on 03/01/2016 13:46:00 Terri Barker ([id number]) -------------------------------------------------------------------------------- Physician Orders Details Patient Name: Terri Barker Date of Service: 03/01/2016 12:45 PM Medical Record Number: [id number] Patient Account Number: [id number] Date of Birth/Sex: Jun 19, 1927 (83 y.o. Female) Treating RN: Afful, RN, BSN, [place] Sink Primary Care Provider: Clovis Pu, JILL Other Clinician: Referring Provider: Clovis Pu, JILL Treating Provider/Extender: Rudene Re in Treatment: 1 Verbal / Phone Orders: No Diagnosis Coding Wound Cleansing Wound #1 Right,Anterior Lower Leg o Clean wound with Normal Saline. Wound #2 Right,Lateral Lower Leg o Clean wound with Normal Saline. Wound #3 Right,Medial Lower Leg o Clean wound with Normal Saline. Wound #4 Right,Posterior Lower Leg o Clean wound with Normal Saline. Wound #6 Right Lower Leg o Clean wound with Normal Saline. Anesthetic Wound #1 Right,Anterior Lower Leg o Topical Lidocaine 4% cream applied to wound bed prior to debridement Wound #2 Right,Lateral Lower Leg o Topical Lidocaine 4% cream applied to wound bed prior to debridement Wound #3 Right,Medial Lower Leg o Topical Lidocaine 4% cream applied to wound bed prior to debridement Wound #4 Right,Posterior Lower Leg o Topical Lidocaine 4% cream applied to wound bed prior to debridement Wound #6 Right Lower Leg o Topical Lidocaine 4% cream applied to wound bed prior to debridement Primary Wound Dressing Wound #1 Right,Anterior  Lower Leg o Santyl Ointment - Medihoney or Manuka Honey (over the counter) Wound #2 Right,Lateral Lower Leg SHANTAE, VANTOL ([id number]) o Santyl Ointment - Medihoney or  Manuka Honey (over the counter) Wound #3 Right,Medial Lower Leg o Santyl Ointment - Medihoney or Manuka Honey (over the counter) Wound #4 Right,Posterior Lower Leg o Santyl Ointment - Medihoney or Manuka Honey (over the counter) Wound #6 Right Lower Leg o Santyl Ointment - Medihoney or Manuka Honey (over the counter) Secondary Dressing Wound #1 Right,Anterior Lower Leg o ABD and Kerlix/Conform Wound #2 Right,Lateral Lower Leg o ABD and Kerlix/Conform Wound #3 Right,Medial Lower Leg o ABD and Kerlix/Conform Wound #4 Right,Posterior Lower Leg o ABD and Kerlix/Conform Wound #6 Right Lower Leg o ABD and Kerlix/Conform Dressing Change Frequency Wound #1 Right,Anterior Lower Leg o Change dressing every day. Wound #2 Right,Lateral Lower Leg o Change dressing every day. Wound #3 Right,Medial Lower Leg o Change dressing every day. Wound #4 Right,Posterior Lower Leg o Change dressing every day. Wound #6 Right Lower Leg o Change dressing every day. Follow-up Appointments Wound #1 Right,Anterior Lower Leg o Return Appointment in 1 week. Wound #2 Right,Lateral Lower Leg CATALENA, STANHOPE ([id number]) o Return Appointment in 1 week. Wound #3 Right,Medial Lower Leg o Return Appointment in 1 week. Wound #4 Right,Posterior Lower Leg o Return Appointment in 1 week. Wound #6 Right Lower Leg o Return Appointment in 1 week. Off-Loading Wound #1 Right,Anterior Lower Leg o Other: - Elevate legs as much as possible. Wound #2 Right,Lateral Lower Leg o Other: - Elevate legs as much as possible. Wound #3 Right,Medial Lower Leg o Other: - Elevate legs as much as possible. Wound #4 Right,Posterior Lower Leg o Other: - Elevate legs as much as possible. Wound #6 Right Lower Leg o Other: - Elevate legs as much as possible. Additional Orders / Instructions Wound #1 Right,Anterior Lower Leg o Increase protein intake. o Other: - Vitamin A, C and  Zinc Wound #2 Right,Lateral Lower Leg o Increase protein intake. o Other: - Vitamin A, C and Zinc Wound #3 Right,Medial Lower Leg o Increase protein intake. o Other: - Vitamin A, C and Zinc Wound #4 Right,Posterior Lower Leg o Increase protein intake. o Other: - Vitamin A, C and Zinc Wound #6 Right Lower Leg o Increase protein intake. o Other: - Vitamin A, C and Zinc Terri Barker, Terri Barker ([id number]) Home Health Wound #1 Right,Anterior Lower Leg o Continue Home Health Visits o Home Health Nurse may visit PRN to address patientos wound care needs. o FACE TO FACE ENCOUNTER: MEDICARE and MEDICAID PATIENTS: I certify that this patient is under my care and that I had a face-to-face encounter that meets the physician face-to-face encounter requirements with this patient on this date. The encounter with the patient was in whole or in part for the following MEDICAL CONDITION: (primary reason for Home Healthcare) MEDICAL NECESSITY: I certify, that based on my findings, NURSING services are a medically necessary home health service. HOME BOUND STATUS: I certify that my clinical findings support that this patient is homebound (i.e., Due to illness or injury, pt requires aid of supportive devices such as crutches, cane, wheelchairs, walkers, the use of special transportation or the assistance of another person to leave their place of residence. There is a normal inability to leave the home and doing so requires considerable and taxing effort. Other absences are for medical reasons / religious services and are infrequent or of short duration when for other reasons). o If current dressing causes regression  in wound condition, may D/C ordered dressing product/s and apply Normal Saline Moist Dressing daily until next Wound Healing Center / Other MD appointment. Notify Wound Healing Center of regression in wound condition at [phone]. o Please direct any NON-WOUND related  issues/requests for orders to patient's Primary Care Physician Wound #2 Right,Lateral Lower Leg o Continue Home Health Visits o Home Health Nurse may visit PRN to address patientos wound care needs. o FACE TO FACE ENCOUNTER: MEDICARE and MEDICAID PATIENTS: I certify that this patient is under my care and that I had a face-to-face encounter that meets the physician face-to-face encounter requirements with this patient on this date. The encounter with the patient was in whole or in part for the following MEDICAL CONDITION: (primary reason for Home Healthcare) MEDICAL NECESSITY: I certify, that based on my findings, NURSING services are a medically necessary home health service. HOME BOUND STATUS: I certify that my clinical findings support that this patient is homebound (i.e., Due to illness or injury, pt requires aid of supportive devices such as crutches, cane, wheelchairs, walkers, the use of special transportation or the assistance of another person to leave their place of residence. There is a normal inability to leave the home and doing so requires considerable and taxing effort. Other absences are for medical reasons / religious services and are infrequent or of short duration when for other reasons). o If current dressing causes regression in wound condition, may D/C ordered dressing product/s and apply Normal Saline Moist Dressing daily until next Wound Healing Center / Other MD appointment. Notify Wound Healing Center of regression in wound condition at [phone]. o Please direct any NON-WOUND related issues/requests for orders to patient's Primary Care Physician Wound #3 Right,Medial Lower Leg o Continue Home Health Visits o Home Health Nurse may visit PRN to address patientos wound care needs. o FACE TO FACE ENCOUNTER: MEDICARE and MEDICAID PATIENTS: I certify that this patient is under my care and that I had a face-to-face encounter that meets the physician  face-to-face encounter requirements with this patient on this date. The encounter with the patient was in whole or in part for the following MEDICAL CONDITION: (primary reason for Home Healthcare) Terri Barker, [place] ([id number]) MEDICAL NECESSITY: I certify, that based on my findings, NURSING services are a medically necessary home health service. HOME BOUND STATUS: I certify that my clinical findings support that this patient is homebound (i.e., Due to illness or injury, pt requires aid of supportive devices such as crutches, cane, wheelchairs, walkers, the use of special transportation or the assistance of another person to leave their place of residence. There is a normal inability to leave the home and doing so requires considerable and taxing effort. Other absences are for medical reasons / religious services and are infrequent or of short duration when for other reasons). o If current dressing causes regression in wound condition, may D/C ordered dressing product/s and apply Normal Saline Moist Dressing daily until next Wound Healing Center / Other MD appointment. Notify Wound Healing Center of regression in wound condition at [phone]. o Please direct any NON-WOUND related issues/requests for orders to patient's Primary Care Physician Wound #4 Right,Posterior Lower Leg o Continue Home Health Visits o Home Health Nurse may visit PRN to address patientos wound care needs. o FACE TO FACE ENCOUNTER: MEDICARE and MEDICAID PATIENTS: I certify that this patient is under my care and that I had a face-to-face encounter that meets the physician face-to-face encounter requirements with this patient on this date. The  encounter with the patient was in whole or in part for the following MEDICAL CONDITION: (primary reason for Home Healthcare) MEDICAL NECESSITY: I certify, that based on my findings, NURSING services are a medically necessary home health service. HOME BOUND STATUS: I certify  that my clinical findings support that this patient is homebound (i.e., Due to illness or injury, pt requires aid of supportive devices such as crutches, cane, wheelchairs, walkers, the use of special transportation or the assistance of another person to leave their place of residence. There is a normal inability to leave the home and doing so requires considerable and taxing effort. Other absences are for medical reasons / religious services and are infrequent or of short duration when for other reasons). o If current dressing causes regression in wound condition, may D/C ordered dressing product/s and apply Normal Saline Moist Dressing daily until next Wound Healing Center / Other MD appointment. Notify Wound Healing Center of regression in wound condition at [phone]. o Please direct any NON-WOUND related issues/requests for orders to patient's Primary Care Physician Wound #6 Right Lower Leg o Continue Home Health Visits o Home Health Nurse may visit PRN to address patientos wound care needs. o FACE TO FACE ENCOUNTER: MEDICARE and MEDICAID PATIENTS: I certify that this patient is under my care and that I had a face-to-face encounter that meets the physician face-to-face encounter requirements with this patient on this date. The encounter with the patient was in whole or in part for the following MEDICAL CONDITION: (primary reason for Home Healthcare) MEDICAL NECESSITY: I certify, that based on my findings, NURSING services are a medically necessary home health service. HOME BOUND STATUS: I certify that my clinical findings support that this patient is homebound (i.e., Due to illness or injury, pt requires aid of supportive devices such as crutches, cane, wheelchairs, walkers, the use of special transportation or the assistance of another person to leave their place of residence. There is a normal inability to leave the home and doing so requires considerable and taxing  effort. Other absences are for medical reasons / religious services and are infrequent or of short duration when for other reasons). CAMYA, HAYDON ([id number]) o If current dressing causes regression in wound condition, may D/C ordered dressing product/s and apply Normal Saline Moist Dressing daily until next Wound Healing Center / Other MD appointment. Notify Wound Healing Center of regression in wound condition at [phone]. o Please direct any NON-WOUND related issues/requests for orders to patient's Primary Care Physician Medications-please add to medication list. Wound #1 Right,Anterior Lower Leg o Santyl Enzymatic Ointment - apply nickel thick to patient's wounds Wound #2 Right,Lateral Lower Leg o Santyl Enzymatic Ointment - apply nickel thick to patient's wounds Wound #3 Right,Medial Lower Leg o Santyl Enzymatic Ointment - apply nickel thick to patient's wounds Wound #4 Right,Posterior Lower Leg o Santyl Enzymatic Ointment - apply nickel thick to patient's wounds Wound #6 Right Lower Leg o Santyl Enzymatic Ointment - apply nickel thick to patient's wounds Electronic Signature(s) Signed: 03/01/2016 4:29:50 PM By: Evlyn Kanner MD, FACS Signed: 03/01/2016 4:29:56 PM By: Elpidio Eric BSN, RN Entered By: Elpidio Eric on 03/01/2016 13:27:26 Terri Barker ([id number]) -------------------------------------------------------------------------------- Problem List Details Patient Name: Terri Barker Date of Service: 03/01/2016 12:45 PM Medical Record Number: [id number] Patient Account Number: [id number] Date of Birth/Sex: 10-24-1927 (83 y.o. Female) Treating RN: Afful, RN, BSN, Leola Sink Primary Care Provider: Clovis Pu, JILL Other Clinician: Referring Provider: Clovis Pu, JILL Treating Provider/Extender: Rudene Re in Treatment: 1 Active Problems  ICD-10 Encounter Code Description Active Date Diagnosis E11.622 Type 2 diabetes mellitus with other skin ulcer 02/23/2016  Yes S81.811A Laceration without foreign body, right lower leg, initial 02/23/2016 Yes encounter L97.212 Non-pressure chronic ulcer of right calf with fat layer 02/23/2016 Yes exposed I89.0 Lymphedema, not elsewhere classified 02/23/2016 Yes I73.9 Peripheral vascular disease, unspecified 02/23/2016 Yes Inactive Problems Resolved Problems Electronic Signature(s) Signed: 03/01/2016 1:29:30 PM By: Evlyn Kanner MD, FACS Entered By: Evlyn Kanner on 03/01/2016 13:29:29 Terri Barker ([id number]) -------------------------------------------------------------------------------- Progress Note Details Patient Name: Terri Barker Date of Service: 03/01/2016 12:45 PM Medical Record Number: [id number] Patient Account Number: [id number] Date of Birth/Sex: 29-Dec-1927 (83 y.o. Female) Treating RN: Afful, RN, BSN, [place] Sink Primary Care Provider: Clovis Pu, JILL Other Clinician: Referring Provider: Clovis Pu, JILL Treating Provider/Extender: Rudene Re in Treatment: 1 Subjective Chief Complaint Information obtained from Patient Patient seen for complaints of Non-Healing Wound to the right lower extremity caused by multiple falls due to being unsteady. These have been there over the last month History of Present Illness (HPI) The following HPI elements were documented for the patient's wound: Location: right lower extremity mainly on the posterior and lateral calf Quality: Patient reports experiencing a dull pain to affected area(s). Severity: Patient states wound are getting worse. Duration: Patient has had the wound for < 4 weeks prior to presenting for treatment Timing: Pain in wound is Intermittent (comes and goes Context: The wound appeared gradually over time Modifying Factors: Other treatment(s) tried include:has been on local treatment with Santyl ointment and also oral doxycycline Associated Signs and Symptoms: Patient reports having increase swelling. 83 year old patient who is in to [place] by  the [facility] health care center, has a past medical history of generalized weakness, chronic atrial fibrillation, type 2 diabetes mellitus without complications, hyperlipidemia, benign neoplasm of bone, essential hypertension. She has been under the care of [facility] for a wound on her left shin since the middle of January 2018. the recommendation was to use triple antibiotic ointment and Telfa daily. 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 At a later visit the patient was also found to have a wound on her right shin of about 3 weeks' duration. The wound was treated with Santyl ointment. Right lower extremity DVT study was negative. Non-invasive arterial study done showed there was hemodynamically significant arterial sclerotic changes involving the right common femoral artery, proximal right superficial femoral artery, right posterior tibial artery and the right DPA. There was also hemodynamically significant arterial sclerotic disease involving the left popliteal artery. An attempt was made to review medical records electronically but none were available on the hospital system. Her most recent INR was 3.1 and her last hemoglobin A1c was 6.5 Beaver, Aurea Graff ([id number]) Objective Constitutional Pulse regular. Respirations normal and unlabored. Afebrile. Vitals Time Taken: 12:55 PM, Height: 63 in, Weight: 122 lbs, BMI: 21.6, Temperature: 98 F, Pulse: 99 bpm, Respiratory Rate: 16 breaths/min, Blood Pressure: 152/70 mmHg. Eyes Nonicteric. Reactive to light. Ears, Nose, Mouth, and Throat Lips, teeth, and gums WNL.Marland Kitchen Moist mucosa without lesions. Neck supple and nontender. No palpable supraclavicular or cervical adenopathy. Normal sized without goiter. Respiratory WNL. No retractions.. Breath sounds WNL, No rubs, rales, rhonchi, or wheeze.. Cardiovascular Heart rhythm and rate regular, no murmur or  gallop.. Pedal Pulses WNL. No clubbing, cyanosis or edema. Chest Breasts symmetical and no nipple discharge.. Breast tissue WNL, no masses, lumps, or tenderness.. Lymphatic No adneopathy.  No adenopathy. No adenopathy. Musculoskeletal Adexa without tenderness or enlargement.. Digits and nails w/o clubbing, cyanosis, infection, petechiae, ischemia, or inflammatory conditions.Marland Kitchen Psychiatric Judgement and insight Intact.. No evidence of depression, anxiety, or agitation.. General Notes: several of the wounds on her right lower extremity on the posterior calf lateral calf and anterior cuff was sharply debrided with a #3 curet and bleeding controlled with pressure. Integumentary (Hair, Skin) No suspicious lesions. No crepitus or fluctuance. No peri-wound warmth or erythema. No masses.. Wound #1 status is Open. Original cause of wound was Gradually Appeared. The wound is located on the Right,Anterior Lower Leg. The wound measures 4.3cm length x 1cm width x 0.2cm depth; 3.377cm^2 area and 0.675cm^3 volume. There is Fat Layer (Subcutaneous Tissue) Exposed exposed. There is no tunneling or undermining noted. There is a large amount of serous drainage noted. The wound margin is flat and intact. There is no granulation within the wound bed. There is a large (67-100%) amount of necrotic tissue Terri Barker, Terri Barker ([id number]) within the wound bed including Eschar and Adherent Slough. The periwound skin appearance exhibited: Maceration, Mottled. Periwound temperature was noted as No Abnormality. The periwound has tenderness on palpation. Wound #2 status is Open. Original cause of wound was Gradually Appeared. The wound is located on the Right,Lateral Lower Leg. The wound measures 1cm length x 2cm width x 0.2cm depth; 1.571cm^2 area and 0.314cm^3 volume. There is Fat Layer (Subcutaneous Tissue) Exposed exposed. There is no tunneling or undermining noted. There is a large amount of drainage noted. The wound margin  is flat and intact. There is small (1-33%) red granulation within the wound bed. There is a large (67-100%) amount of necrotic tissue within the wound bed including Adherent Slough. The periwound skin appearance exhibited: Mottled. The periwound skin appearance did not exhibit: Callus, Crepitus, Excoriation, Induration, Rash, Scarring, Dry/Scaly, Maceration, Atrophie Blanche, Cyanosis, Ecchymosis, Hemosiderin Staining, Pallor, Rubor, Erythema. Periwound temperature was noted as No Abnormality. The periwound has tenderness on palpation. Wound #3 status is Open. Original cause of wound was Gradually Appeared. The wound is located on the Right,Medial Lower Leg. The wound measures 1cm length x 0.9cm width x 0.3cm depth; 0.707cm^2 area and 0.212cm^3 volume. There is Fat Layer (Subcutaneous Tissue) Exposed exposed. There is no tunneling or undermining noted. There is a large amount of serous drainage noted. The wound margin is flat and intact. There is no granulation within the wound bed. There is a large (67-100%) amount of necrotic tissue within the wound bed including Adherent Slough. The periwound skin appearance exhibited: Excoriation, Mottled. The periwound skin appearance did not exhibit: Callus, Crepitus, Induration, Rash, Scarring, Dry/Scaly, Maceration, Atrophie Blanche, Cyanosis, Ecchymosis, Hemosiderin Staining, Pallor, Rubor, Erythema. Periwound temperature was noted as No Abnormality. The periwound has tenderness on palpation. Wound #4 status is Open. Original cause of wound was Trauma. The wound is located on the Right,Posterior Lower Leg. The wound measures 6cm length x 3.5cm width x 0.4cm depth; 16.493cm^2 area and 6.597cm^3 volume. There is Fat Layer (Subcutaneous Tissue) Exposed exposed. There is no tunneling or undermining noted. There is a large amount of serous drainage noted. The wound margin is flat and intact. There is no granulation within the wound bed. There is a large  (67-100%) amount of necrotic tissue within the wound bed including Adherent Slough. The periwound skin appearance exhibited: Mottled. The periwound skin appearance did not exhibit: Callus, Crepitus, Excoriation, Induration, Rash, Scarring, Dry/Scaly, Maceration, Atrophie Blanche, Cyanosis, Ecchymosis, Hemosiderin Staining, Pallor, Rubor, Erythema. Periwound temperature was noted as No  Abnormality. Wound #5 status is Healed - Epithelialized. Original cause of wound was Trauma. The wound is located on the Left Lower Leg. The wound measures 0cm length x 0cm width x 0cm depth; 0cm^2 area and 0cm^3 volume. The wound is limited to skin breakdown. There is no tunneling or undermining noted. There is a none present amount of drainage noted. The wound margin is flat and intact. There is no granulation within the wound bed. There is no necrotic tissue within the wound bed. The periwound skin appearance exhibited: Dry/Scaly. The periwound skin appearance did not exhibit: Callus, Crepitus, Excoriation, Induration, Rash, Scarring, Maceration, Atrophie Blanche, Cyanosis, Ecchymosis, Hemosiderin Staining, Mottled, Pallor, Rubor, Erythema. Periwound temperature was noted as No Abnormality. Wound #6 status is Open. Original cause of wound was Trauma. The wound is located on the Right Lower Leg. The wound measures 2.5cm length x 2.5cm width x 0.3cm depth; 4.909cm^2 area and 1.473cm^3 volume. There is Fat Layer (Subcutaneous Tissue) Exposed exposed. There is no tunneling or undermining noted. There is a large amount of serous drainage noted. The wound margin is flat and intact. There is no granulation within the wound bed. There is a large (67-100%) amount of necrotic tissue within the wound bed including Eschar. The periwound skin appearance exhibited: Mottled. The periwound skin appearance Terri Barker, Terri Barker ([id number]) did not exhibit: Callus, Crepitus, Excoriation, Induration, Rash, Scarring, Dry/Scaly, Maceration,  Atrophie Blanche, Cyanosis, Ecchymosis, Hemosiderin Staining, Pallor, Rubor, Erythema. Periwound temperature was noted as No Abnormality. The periwound has tenderness on palpation. Assessment Active Problems ICD-10 E11.622 - Type 2 diabetes mellitus with other skin ulcer S81.811A - Laceration without foreign body, right lower leg, initial encounter L97.212 - Non-pressure chronic ulcer of right calf with fat layer exposed I89.0 - Lymphedema, not elsewhere classified I73.9 - Peripheral vascular disease, unspecified Procedures Wound #1 Wound #1 is a Diabetic Wound/Ulcer of the Lower Extremity located on the Right,Anterior Lower Leg . There was a Skin/Subcutaneous Tissue Debridement ([id number]) debridement with total area of 4.3 sq cm performed by Evlyn Kanner, MD. with the following instrument(s): Curette to remove Viable and Non- Viable tissue/material including Fibrin/Slough and Subcutaneous after achieving pain control using Lidocaine 4% Topical Solution. A time out was conducted at 13:21, prior to the start of the procedure. A Minimum amount of bleeding was controlled with Pressure. The procedure was tolerated well with a pain level of 3 throughout and a pain level of 3 following the procedure. Post Debridement Measurements: 4.3cm length x 1cm width x 0.3cm depth; 1.013cm^3 volume. Character of Wound/Ulcer Post Debridement requires further debridement. Severity of Tissue Post Debridement is: Fat layer exposed. Post procedure Diagnosis Wound #1: Same as Pre-Procedure Wound #3 Wound #3 is a Diabetic Wound/Ulcer of the Lower Extremity located on the Right,Medial Lower Leg . There was a Skin/Subcutaneous Tissue Debridement ([id number]) debridement with total area of 0.9 sq cm performed by Evlyn Kanner, MD. with the following instrument(s): Curette to remove Viable and Non-Viable tissue/material including Fibrin/Slough and Subcutaneous after achieving pain control using Lidocaine  4% Topical Solution. A time out was conducted at 13:21, prior to the start of the procedure. A Minimum amount of bleeding was controlled with Pressure. The procedure was tolerated well with a pain level of 3 throughout and a pain level of 3 following the procedure. Post Debridement Measurements: 1cm length x 0.9cm width x 0.3cm depth; 0.212cm^3 volume. Character of Wound/Ulcer Post Debridement requires further debridement. Severity of Tissue Post Debridement is: Fat layer exposed. Terri Barker, Terri Barker ([id number]) Post procedure Diagnosis  Wound #3: Same as Pre-Procedure Wound #4 Wound #4 is a Diabetic Wound/Ulcer of the Lower Extremity located on the Right,Posterior Lower Leg . There was a Skin/Subcutaneous Tissue Debridement ([id number]) debridement with total area of 21 sq cm performed by Evlyn Kanner, MD. with the following instrument(s): Curette to remove Non-Viable tissue/material including Fibrin/Slough and Subcutaneous after achieving pain control using Lidocaine 4% Topical Solution. A time out was conducted at 13:22, prior to the start of the procedure. A Minimum amount of bleeding was controlled with Pressure. The procedure was tolerated well with a pain level of 3 throughout and a pain level of 3 following the procedure. Post Debridement Measurements: 6cm length x 3.5cm width x 0.4cm depth; 6.597cm^3 volume. Character of Wound/Ulcer Post Debridement requires further debridement. Severity of Tissue Post Debridement is: Fat layer exposed. Post procedure Diagnosis Wound #4: Same as Pre-Procedure Wound #6 Wound #6 is a Diabetic Wound/Ulcer of the Lower Extremity located on the Right Lower Leg . There was a Skin/Subcutaneous Tissue Debridement ([id number]) debridement with total area of 6.25 sq cm performed by Evlyn Kanner, MD. with the following instrument(s): Curette to remove Viable and Non-Viable tissue/material including Fibrin/Slough and Subcutaneous after achieving pain control using  Lidocaine 4% Topical Solution. A time out was conducted at 13:21, prior to the start of the procedure. A Minimum amount of bleeding was controlled with Pressure. The procedure was tolerated well with a pain level of 3 throughout and a pain level of 3 following the procedure. Post Debridement Measurements: 2.5cm length x 2.5cm width x 0.3cm depth; 1.473cm^3 volume. Character of Wound/Ulcer Post Debridement requires further debridement. Severity of Tissue Post Debridement is: Fat layer exposed. Post procedure Diagnosis Wound #6: Same as Pre-Procedure Plan Wound Cleansing: Wound #1 Right,Anterior Lower Leg: Clean wound with Normal Saline. Wound #2 Right,Lateral Lower Leg: Clean wound with Normal Saline. Wound #3 Right,Medial Lower Leg: Clean wound with Normal Saline. Wound #4 Right,Posterior Lower Leg: Clean wound with Normal Saline. Wound #6 Right Lower Leg: Clean wound with Normal Saline. Anesthetic: Wound #1 Right,Anterior Lower Leg: Terri Barker, Terri Barker ([id number]) Topical Lidocaine 4% cream applied to wound bed prior to debridement Wound #2 Right,Lateral Lower Leg: Topical Lidocaine 4% cream applied to wound bed prior to debridement Wound #3 Right,Medial Lower Leg: Topical Lidocaine 4% cream applied to wound bed prior to debridement Wound #4 Right,Posterior Lower Leg: Topical Lidocaine 4% cream applied to wound bed prior to debridement Wound #6 Right Lower Leg: Topical Lidocaine 4% cream applied to wound bed prior to debridement Primary Wound Dressing: Wound #1 Right,Anterior Lower Leg: Santyl Ointment - Medihoney or Manuka Honey (over the counter) Wound #2 Right,Lateral Lower Leg: Santyl Ointment - Medihoney or Manuka Honey (over the counter) Wound #3 Right,Medial Lower Leg: Santyl Ointment - Medihoney or Manuka Honey (over the counter) Wound #4 Right,Posterior Lower Leg: Santyl Ointment - Medihoney or Manuka Honey (over the counter) Wound #6 Right Lower Leg: Santyl Ointment -  Medihoney or Manuka Honey (over the counter) Secondary Dressing: Wound #1 Right,Anterior Lower Leg: ABD and Kerlix/Conform Wound #2 Right,Lateral Lower Leg: ABD and Kerlix/Conform Wound #3 Right,Medial Lower Leg: ABD and Kerlix/Conform Wound #4 Right,Posterior Lower Leg: ABD and Kerlix/Conform Wound #6 Right Lower Leg: ABD and Kerlix/Conform Dressing Change Frequency: Wound #1 Right,Anterior Lower Leg: Change dressing every day. Wound #2 Right,Lateral Lower Leg: Change dressing every day. Wound #3 Right,Medial Lower Leg: Change dressing every day. Wound #4 Right,Posterior Lower Leg: Change dressing every day. Wound #6 Right Lower Leg: Change dressing every day.  Follow-up Appointments: Wound #1 Right,Anterior Lower Leg: Return Appointment in 1 week. Wound #2 Right,Lateral Lower Leg: Return Appointment in 1 week. Wound #3 Right,Medial Lower Leg: Return Appointment in 1 week. Wound #4 Right,Posterior Lower Leg: Return Appointment in 1 week. ALEXA, BLISH ([id number]) Wound #6 Right Lower Leg: Return Appointment in 1 week. Off-Loading: Wound #1 Right,Anterior Lower Leg: Other: - Elevate legs as much as possible. Wound #2 Right,Lateral Lower Leg: Other: - Elevate legs as much as possible. Wound #3 Right,Medial Lower Leg: Other: - Elevate legs as much as possible. Wound #4 Right,Posterior Lower Leg: Other: - Elevate legs as much as possible. Wound #6 Right Lower Leg: Other: - Elevate legs as much as possible. Additional Orders / Instructions: Wound #1 Right,Anterior Lower Leg: Increase protein intake. Other: - Vitamin A, C and Zinc Wound #2 Right,Lateral Lower Leg: Increase protein intake. Other: - Vitamin A, C and Zinc Wound #3 Right,Medial Lower Leg: Increase protein intake. Other: - Vitamin A, C and Zinc Wound #4 Right,Posterior Lower Leg: Increase protein intake. Other: - Vitamin A, C and Zinc Wound #6 Right Lower Leg: Increase protein intake. Other: - Vitamin  A, C and Zinc Home Health: Wound #1 Right,Anterior Lower Leg: Continue Home Health Visits Home Health Nurse may visit PRN to address patient s wound care needs. FACE TO FACE ENCOUNTER: MEDICARE and MEDICAID PATIENTS: I certify that this patient is under my care and that I had a face-to-face encounter that meets the physician face-to-face encounter requirements with this patient on this date. The encounter with the patient was in whole or in part for the following MEDICAL CONDITION: (primary reason for Home Healthcare) MEDICAL NECESSITY: I certify, that based on my findings, NURSING services are a medically necessary home health service. HOME BOUND STATUS: I certify that my clinical findings support that this patient is homebound (i.e., Due to illness or injury, pt requires aid of supportive devices such as crutches, cane, wheelchairs, walkers, the use of special transportation or the assistance of another person to leave their place of residence. There is a normal inability to leave the home and doing so requires considerable and taxing effort. Other absences are for medical reasons / religious services and are infrequent or of short duration when for other reasons). If current dressing causes regression in wound condition, may D/C ordered dressing product/s and apply Normal Saline Moist Dressing daily until next Wound Healing Center / Other MD appointment. Notify Wound Healing Center of regression in wound condition at [phone]. Please direct any NON-WOUND related issues/requests for orders to patient's Primary Care Physician Wound #2 Right,Lateral Lower Leg: Continue Home Health Visits Home Health Nurse may visit PRN to address patient s wound care needs. FACE TO FACE ENCOUNTER: MEDICARE and MEDICAID PATIENTS: I certify that this patient is under MILEY, BLANCHETT ([id number]) my care and that I had a face-to-face encounter that meets the physician face-to-face encounter requirements with  this patient on this date. The encounter with the patient was in whole or in part for the following MEDICAL CONDITION: (primary reason for Home Healthcare) MEDICAL NECESSITY: I certify, that based on my findings, NURSING services are a medically necessary home health service. HOME BOUND STATUS: I certify that my clinical findings support that this patient is homebound (i.e., Due to illness or injury, pt requires aid of supportive devices such as crutches, cane, wheelchairs, walkers, the use of special transportation or the assistance of another person to leave their place of residence. There is a normal inability to leave  the home and doing so requires considerable and taxing effort. Other absences are for medical reasons / religious services and are infrequent or of short duration when for other reasons). If current dressing causes regression in wound condition, may D/C ordered dressing product/s and apply Normal Saline Moist Dressing daily until next Wound Healing Center / Other MD appointment. Notify Wound Healing Center of regression in wound condition at [phone]. Please direct any NON-WOUND related issues/requests for orders to patient's Primary Care Physician Wound #3 Right,Medial Lower Leg: Continue Home Health Visits Home Health Nurse may visit PRN to address patient s wound care needs. FACE TO FACE ENCOUNTER: MEDICARE and MEDICAID PATIENTS: I certify that this patient is under my care and that I had a face-to-face encounter that meets the physician face-to-face encounter requirements with this patient on this date. The encounter with the patient was in whole or in part for the following MEDICAL CONDITION: (primary reason for Home Healthcare) MEDICAL NECESSITY: I certify, that based on my findings, NURSING services are a medically necessary home health service. HOME BOUND STATUS: I certify that my clinical findings support that this patient is homebound (i.e., Due to illness or  injury, pt requires aid of supportive devices such as crutches, cane, wheelchairs, walkers, the use of special transportation or the assistance of another person to leave their place of residence. There is a normal inability to leave the home and doing so requires considerable and taxing effort. Other absences are for medical reasons / religious services and are infrequent or of short duration when for other reasons). If current dressing causes regression in wound condition, may D/C ordered dressing product/s and apply Normal Saline Moist Dressing daily until next Wound Healing Center / Other MD appointment. Notify Wound Healing Center of regression in wound condition at [phone]. Please direct any NON-WOUND related issues/requests for orders to patient's Primary Care Physician Wound #4 Right,Posterior Lower Leg: Continue Home Health Visits Home Health Nurse may visit PRN to address patient s wound care needs. FACE TO FACE ENCOUNTER: MEDICARE and MEDICAID PATIENTS: I certify that this patient is under my care and that I had a face-to-face encounter that meets the physician face-to-face encounter requirements with this patient on this date. The encounter with the patient was in whole or in part for the following MEDICAL CONDITION: (primary reason for Home Healthcare) MEDICAL NECESSITY: I certify, that based on my findings, NURSING services are a medically necessary home health service. HOME BOUND STATUS: I certify that my clinical findings support that this patient is homebound (i.e., Due to illness or injury, pt requires aid of supportive devices such as crutches, cane, wheelchairs, walkers, the use of special transportation or the assistance of another person to leave their place of residence. There is a normal inability to leave the home and doing so requires considerable and taxing effort. Other absences are for medical reasons / religious services and are infrequent or of short duration  when for other reasons). If current dressing causes regression in wound condition, may D/C ordered dressing product/s and apply Normal Saline Moist Dressing daily until next Wound Healing Center / Other MD appointment. Notify Wound Healing Center of regression in wound condition at [phone]. Please direct any NON-WOUND related issues/requests for orders to patient's Primary Care Physician Wound #6 Right Lower Leg: Continue Home Health Visits Home Health Nurse may visit PRN to address patient s wound care needs. FACE TO FACE ENCOUNTER: MEDICARE and MEDICAID PATIENTS: I certify that this patient is under Terri Barker (  [id number]) my care and that I had a face-to-face encounter that meets the physician face-to-face encounter requirements with this patient on this date. The encounter with the patient was in whole or in part for the following MEDICAL CONDITION: (primary reason for Home Healthcare) MEDICAL NECESSITY: I certify, that based on my findings, NURSING services are a medically necessary home health service. HOME BOUND STATUS: I certify that my clinical findings support that this patient is homebound (i.e., Due to illness or injury, pt requires aid of supportive devices such as crutches, cane, wheelchairs, walkers, the use of special transportation or the assistance of another person to leave their place of residence. There is a normal inability to leave the home and doing so requires considerable and taxing effort. Other absences are for medical reasons / religious services and are infrequent or of short duration when for other reasons). If current dressing causes regression in wound condition, may D/C ordered dressing product/s and apply Normal Saline Moist Dressing daily until next Wound Healing Center / Other MD appointment. Notify Wound Healing Center of regression in wound condition at [phone]. Please direct any NON-WOUND related issues/requests for orders to patient's Primary  Care Physician Medications-please add to medication list.: Wound #1 Right,Anterior Lower Leg: Santyl Enzymatic Ointment - apply nickel thick to patient's wounds Wound #2 Right,Lateral Lower Leg: Santyl Enzymatic Ointment - apply nickel thick to patient's wounds Wound #3 Right,Medial Lower Leg: Santyl Enzymatic Ointment - apply nickel thick to patient's wounds Wound #4 Right,Posterior Lower Leg: Santyl Enzymatic Ointment - apply nickel thick to patient's wounds Wound #6 Right Lower Leg: Santyl Enzymatic Ointment - apply nickel thick to patient's wounds After review I have recommended: 1. To establish with a PCP regarding her general health including diabetes mellitus.they have seen the PCP and under new medications 2. To see Dr. Nanetta Batty regarding her arterial disease peripherally and also her cardiology workup. appointment next week 3. Santyl ointment locally to be applied to the wounds with a light Kerlix dressing. 4. elevation and exercise has been discussed in great detail 5. Her DVT study was negative but at some stage she will need a venous reflux study 6. Adequate protein, vitamin A, vitamin C and zinc. Her granddaughter was at the bedside has had all questions answered. Electronic Signature(s) Signed: 03/01/2016 1:47:39 PM By: Evlyn Kanner MD, FACS Entered By: Evlyn Kanner on 03/01/2016 13:47:39 Terri Barker ([id number]) -------------------------------------------------------------------------------- SuperBill Details Patient Name: Terri Barker Date of Service: 03/01/2016 Medical Record Number: [id number] Patient Account Number: [id number] Date of Birth/Sex: [birth year] (83 y.o. Female) Treating RN: Afful, RN, BSN, [place] Primary Care Provider: Clovis Pu, JILL Other Clinician: Referring Provider: Clovis Pu, JILL Treating Provider/Extender: Evlyn Kanner Service Line: Outpatient Weeks in Treatment: 1 Diagnosis Coding ICD-10 Codes Code Description E11.622 Type 2 diabetes  mellitus with other skin ulcer S81.811A Laceration without foreign body, right lower leg, initial encounter L97.212 Non-pressure chronic ulcer of right calf with fat layer exposed I89.0 Lymphedema, not elsewhere classified I73.9 Peripheral vascular disease, unspecified Facility Procedures CPT4 Code Description: 84132440 11042 - DEB SUBQ TISSUE 20 SQ CM/< ICD-10 Description Diagnosis E11.622 Type 2 diabetes mellitus with other skin ulcer S81.811A Laceration without foreign body, right lower leg, init L97.212 Non-pressure chronic ulcer of  right calf with fat laye I89.0 Lymphedema, not elsewhere classified Modifier: ial encounte r exposed Quantity: 1 r CPT4 Code Description: 10272536 11045 - DEB SUBQ TISS EA ADDL 20CM ICD-10 Description Diagnosis E11.622 Type 2 diabetes mellitus with other skin ulcer L97.212 Non-pressure chronic  ulcer of right calf with fat laye S81.811A Laceration without foreign  body, right lower leg, init I89.0 Lymphedema, not elsewhere classified Modifier: r exposed ial encounte Quantity: 1 r Physician Procedures CPT4 Code Description: 86578466770168 11042 - WC PHYS SUBQ TISS 20 SQ CM ICD-10 Description Diagnosis E11.622 Type 2 diabetes mellitus with other skin ulcer S81.811A Laceration without foreign body, right lower leg, init Terri RouteBARKER, Lyra ([id number]) Modifier: ial encounte Quantity: 1 r Electronic Signature(s) Signed: 03/01/2016 1:48:15 PM By: Evlyn KannerBritto, Merced Hanners MD, FACS Entered By: Evlyn KannerBritto, Amogh Komatsu on 03/01/2016 13:48:15

## 2016-03-07 ENCOUNTER — Ambulatory Visit (INDEPENDENT_AMBULATORY_CARE_PROVIDER_SITE_OTHER): Payer: Medicare Other | Admitting: Cardiovascular Disease

## 2016-03-07 ENCOUNTER — Encounter: Payer: Self-pay | Admitting: Cardiovascular Disease

## 2016-03-07 VITALS — BP 130/62 | HR 108 | Ht 63.0 in | Wt 125.0 lb

## 2016-03-07 DIAGNOSIS — I872 Venous insufficiency (chronic) (peripheral): Secondary | ICD-10-CM | POA: Diagnosis not present

## 2016-03-07 DIAGNOSIS — E108 Type 1 diabetes mellitus with unspecified complications: Secondary | ICD-10-CM | POA: Insufficient documentation

## 2016-03-07 DIAGNOSIS — R600 Localized edema: Secondary | ICD-10-CM | POA: Insufficient documentation

## 2016-03-07 DIAGNOSIS — L97929 Non-pressure chronic ulcer of unspecified part of left lower leg with unspecified severity: Secondary | ICD-10-CM | POA: Diagnosis not present

## 2016-03-07 DIAGNOSIS — I998 Other disorder of circulatory system: Secondary | ICD-10-CM

## 2016-03-07 DIAGNOSIS — E785 Hyperlipidemia, unspecified: Secondary | ICD-10-CM | POA: Insufficient documentation

## 2016-03-07 DIAGNOSIS — I10 Essential (primary) hypertension: Secondary | ICD-10-CM

## 2016-03-07 DIAGNOSIS — E7800 Pure hypercholesterolemia, unspecified: Secondary | ICD-10-CM | POA: Diagnosis not present

## 2016-03-07 DIAGNOSIS — L97919 Non-pressure chronic ulcer of unspecified part of right lower leg with unspecified severity: Secondary | ICD-10-CM | POA: Diagnosis not present

## 2016-03-07 DIAGNOSIS — I482 Chronic atrial fibrillation, unspecified: Secondary | ICD-10-CM | POA: Insufficient documentation

## 2016-03-07 DIAGNOSIS — I5032 Chronic diastolic (congestive) heart failure: Secondary | ICD-10-CM

## 2016-03-07 DIAGNOSIS — I70229 Atherosclerosis of native arteries of extremities with rest pain, unspecified extremity: Secondary | ICD-10-CM | POA: Insufficient documentation

## 2016-03-07 DIAGNOSIS — I270 Primary pulmonary hypertension: Secondary | ICD-10-CM | POA: Insufficient documentation

## 2016-03-07 MED ORDER — FUROSEMIDE 20 MG PO TABS: 20.0000 mg | ORAL_TABLET | Freq: Every day | ORAL | 3 refills | Status: AC

## 2016-03-07 NOTE — Addendum Note (Signed)
Addended by: Evans LanceSTOVER, Puja Caffey W on: 03/07/2016 11:03 AM   Modules accepted: Orders

## 2016-03-07 NOTE — Assessment & Plan Note (Signed)
History of hyperlipidemia on atorvastatin followed by her PCP 

## 2016-03-07 NOTE — Patient Instructions (Signed)
Medication Instructions: Increase Lasix to 20 mg daily.   Labwork: Your physician recommends that you return for lab work: BMET in 3 weeks (03/21/16)   Testing/Procedures: Your physician has requested that you have a lower extremity arterial duplex. During this test, ultrasound is used to evaluate arterial blood flow in the legs. Allow one hour for this exam. There are no restrictions or special instructions.  Your physician has requested that you have an ankle brachial index (ABI). During this test an ultrasound and blood pressure cuff are used to evaluate the arteries that supply the arms and legs with blood. Allow thirty minutes for this exam. There are no restrictions or special instructions.  Follow-Up: Your physician recommends that you schedule a follow-up appointment in: 3 months with Dr. Allyson SabalBerry.  If you need a refill on your cardiac medications before your next appointment, please call your pharmacy.

## 2016-03-07 NOTE — Assessment & Plan Note (Signed)
Ms. Terri Barker was referred by Dr. Meyer RusselBritto at the wound care center for evaluation of critical limb ischemia. He recently moved from syncope. She moved to [place] in late January. She does have chronic atrial fibrillation and was on oral anticoagulation up until yesterday. She has bilateral lower extremity edema with venous stasis changes and nonhealing ulcers on her pretibial areas and care of consistent with venous insufficiency ulcers. She has no wounds on her foot. I do feel pedal pulses. I'm going to increase her Lasix from 10-20 mg a day. We will get lower extremity arterial Doppler studies. I will see her back in 3 months.

## 2016-03-07 NOTE — Assessment & Plan Note (Signed)
History of chronic diastolic heart failure or low-dose diuretics. She does have 2-3+ pitting edema. I'm going to increase her Lasix from 10-20 mg a day and will recheck electrolytes. She has had a 2-D echo in the past which showed normal LV systolic function.

## 2016-03-07 NOTE — Assessment & Plan Note (Signed)
Long history of chronic atrial fibrillation on metoprolol and Cardizem as well as Eliquis. Her metoprolol and Eliquis were discontinued by her PCP. Her heart rate at home is in the 60s and 70s on Cardizem.

## 2016-03-07 NOTE — Progress Notes (Signed)
03/07/2016 Terri Barker   September 10, 1927  [id number]  Primary Physician Clovis Pu, Arturo Morton, DO Primary Cardiologist: Runell Gess MD Nicholes Calamity, [place]  HPI:  Terri Barker is an 83 year old mildly overweight widowed Caucasian female mother of one child (who is deceased),  grandmother to 3 grandchildren who is accompanied by her granddaughter Terri Barker today. She was referred by Dr. Meyer Russel at the wound care center for peripheral vascular evaluation because of nonhealing wounds on her right shin and calf. Her history is remarkable for chronic A. fib on oral hydration until recently, treated hypertension, hyperlipidemia and diabetes. She does have a history of diastolic heart failure. She was recently relocated [place] to [place] in late January because of falls that occurred in an assisted care facility there and to be closer to her family who she lives with. She has never had a heart attack or stroke. She is minimally ambulatory. She has been seen in the wound care folks for the last month or so on a weekly basis and has slowly healing wounds on her pretibial area that appear to be related to venous insufficiency.   Current Outpatient Prescriptions  Medication Sig Dispense Refill  . aspirin 81 MG chewable tablet Chew 81 mg by mouth daily.    . busPIRone (BUSPAR) 7.5 MG tablet Take 7.5 mg by mouth 2 (two) times daily.    . enalapril (VASOTEC) 10 MG tablet Take 10 mg by mouth daily.    Marland Kitchen glimepiride (AMARYL) 1 MG tablet Take 1 mg by mouth 2 (two) times daily.    . potassium chloride SA (K-DUR,KLOR-CON) 20 MEQ tablet Take 20 mEq by mouth daily.    . traMADol (ULTRAM) 50 MG tablet Take 50 mg by mouth every 6 (six) hours as needed.    . furosemide (LASIX) 20 MG tablet Take 1 tablet (20 mg total) by mouth daily. 90 tablet 3   No current facility-administered medications for this visit.     Allergies  Allergen Reactions  . Codeine   . Feldene [Piroxicam]   . Sporanox  [Itraconazole]   . Sulfa Antibiotics     Social History   Social History  . Marital status: Unknown    Spouse name: N/A  . Number of children: N/A  . Years of education: N/A   Occupational History  . Not on file.   Social History Main Topics  . Smoking status: Former Games developer  . Smokeless tobacco: Never Used  . Alcohol use Not on file  . Drug use: Unknown  . Sexual activity: Not on file   Other Topics Concern  . Not on file   Social History Narrative  . No narrative on file     Review of Systems: General: negative for chills, fever, night sweats or weight changes.  Cardiovascular: negative for chest pain, dyspnea on exertion, edema, orthopnea, palpitations, paroxysmal nocturnal dyspnea or shortness of breath Dermatological: negative for rash Respiratory: negative for cough or wheezing Urologic: negative for hematuria Abdominal: negative for nausea, vomiting, diarrhea, bright red blood per rectum, melena, or hematemesis Neurologic: negative for visual changes, syncope, or dizziness All other systems reviewed and are otherwise negative except as noted above.    Blood pressure 130/62, pulse (!) 108, height 5\' 3"  (1.6 m), weight 125 lb (56.7 kg).  General appearance: alert and no distress Neck: no adenopathy, no carotid bruit, no JVD, supple, symmetrical, trachea midline and thyroid not enlarged, symmetric, no tenderness/mass/nodules Lungs: Diffuse wheezing and rhonchi  Heart: irregularly irregular rhythm Extremities: To 3+ pitting edema with venous stasis changes and ulcerations consistent with venous insufficiency.  EKG atrial fibrillation with a ventricular response of 108 and inferior infarct as well as old anterolateral infarct. I personally reviewed this EKG.  ASSESSMENT AND PLAN:   Critical lower limb ischemia Terri Barker was referred by Dr. Meyer RusselBritto at the wound care center for evaluation of critical limb ischemia. He recently moved from syncope. [place] moved to  [place] in late January. She does have chronic atrial fibrillation and was on oral anticoagulation up until yesterday. She has bilateral lower extremity edema with venous stasis changes and nonhealing ulcers on her pretibial areas and care of consistent with venous insufficiency ulcers. She has no wounds on her foot. I do feel pedal pulses. I'm going to increase her Lasix from 10-20 mg a day. We will get lower extremity arterial Doppler studies. I will see her back in 3 months.  Chronic atrial fibrillation (HCC) Long history of chronic atrial fibrillation on metoprolol and Cardizem as well as Eliquis. Her metoprolol and Eliquis were discontinued by her PCP. Her heart rate at home is in the 60s and 70s on Cardizem.  Essential hypertension History of essential hypertension blood pressure measured 130/62. She is on Cardizem, and enalapril. Continue current meds at current dosing  Hyperlipidemia History of hyperlipidemia on atorvastatin followed by her PCP  Chronic diastolic heart failure (HCC) History of chronic diastolic heart failure or low-dose diuretics. She does have 2-3+ pitting edema. I'm going to increase her Lasix from 10-20 mg a day and will recheck electrolytes. She has had a 2-D echo in the past which showed normal LV systolic function.      Runell GessJonathan J. Bralon Antkowiak MD FACP,FACC,FAHA, [facility] 03/07/2016 10:40 AM

## 2016-03-07 NOTE — Assessment & Plan Note (Signed)
History of essential hypertension blood pressure measured 130/62. She is on Cardizem, and enalapril. Continue current meds at current dosing

## 2016-03-08 ENCOUNTER — Emergency Department: Payer: Medicare Other

## 2016-03-08 ENCOUNTER — Inpatient Hospital Stay
Admission: EM | Admit: 2016-03-08 | Discharge: 2016-03-12 | DRG: 291 | Disposition: A | Discharge status: Home Health | Payer: Medicare Other | Attending: Internal Medicine | Admitting: Internal Medicine

## 2016-03-08 ENCOUNTER — Ambulatory Visit: Payer: Medicare Other | Admitting: Surgery

## 2016-03-08 ENCOUNTER — Encounter: Payer: Self-pay | Admitting: Emergency Medicine

## 2016-03-08 DIAGNOSIS — L97919 Non-pressure chronic ulcer of unspecified part of right lower leg with unspecified severity: Secondary | ICD-10-CM | POA: Diagnosis present

## 2016-03-08 DIAGNOSIS — Z7901 Long term (current) use of anticoagulants: Secondary | ICD-10-CM

## 2016-03-08 DIAGNOSIS — Z888 Allergy status to other drugs, medicaments and biological substances status: Secondary | ICD-10-CM

## 2016-03-08 DIAGNOSIS — I110 Hypertensive heart disease with heart failure: Secondary | ICD-10-CM | POA: Diagnosis present

## 2016-03-08 DIAGNOSIS — E11622 Type 2 diabetes mellitus with other skin ulcer: Secondary | ICD-10-CM | POA: Diagnosis present

## 2016-03-08 DIAGNOSIS — I872 Venous insufficiency (chronic) (peripheral): Secondary | ICD-10-CM | POA: Diagnosis present

## 2016-03-08 DIAGNOSIS — R0902 Hypoxemia: Secondary | ICD-10-CM

## 2016-03-08 DIAGNOSIS — I509 Heart failure, unspecified: Secondary | ICD-10-CM

## 2016-03-08 DIAGNOSIS — I482 Chronic atrial fibrillation: Secondary | ICD-10-CM | POA: Diagnosis present

## 2016-03-08 DIAGNOSIS — Z87891 Personal history of nicotine dependence: Secondary | ICD-10-CM

## 2016-03-08 DIAGNOSIS — R296 Repeated falls: Secondary | ICD-10-CM | POA: Diagnosis present

## 2016-03-08 DIAGNOSIS — J441 Chronic obstructive pulmonary disease with (acute) exacerbation: Secondary | ICD-10-CM | POA: Diagnosis present

## 2016-03-08 DIAGNOSIS — Z885 Allergy status to narcotic agent status: Secondary | ICD-10-CM

## 2016-03-08 DIAGNOSIS — M4800 Spinal stenosis, site unspecified: Secondary | ICD-10-CM | POA: Diagnosis present

## 2016-03-08 DIAGNOSIS — E876 Hypokalemia: Secondary | ICD-10-CM | POA: Diagnosis present

## 2016-03-08 DIAGNOSIS — I5031 Acute diastolic (congestive) heart failure: Secondary | ICD-10-CM | POA: Diagnosis not present

## 2016-03-08 DIAGNOSIS — Z882 Allergy status to sulfonamides status: Secondary | ICD-10-CM

## 2016-03-08 DIAGNOSIS — I878 Other specified disorders of veins: Secondary | ICD-10-CM | POA: Diagnosis present

## 2016-03-08 DIAGNOSIS — Z79899 Other long term (current) drug therapy: Secondary | ICD-10-CM

## 2016-03-08 DIAGNOSIS — J9621 Acute and chronic respiratory failure with hypoxia: Secondary | ICD-10-CM | POA: Diagnosis present

## 2016-03-08 DIAGNOSIS — M810 Age-related osteoporosis without current pathological fracture: Secondary | ICD-10-CM | POA: Diagnosis present

## 2016-03-08 DIAGNOSIS — I5043 Acute on chronic combined systolic (congestive) and diastolic (congestive) heart failure: Secondary | ICD-10-CM | POA: Diagnosis present

## 2016-03-08 DIAGNOSIS — I5033 Acute on chronic diastolic (congestive) heart failure: Secondary | ICD-10-CM | POA: Diagnosis present

## 2016-03-08 DIAGNOSIS — I998 Other disorder of circulatory system: Secondary | ICD-10-CM | POA: Diagnosis present

## 2016-03-08 DIAGNOSIS — Z7982 Long term (current) use of aspirin: Secondary | ICD-10-CM

## 2016-03-08 DIAGNOSIS — T447X6A Underdosing of beta-adrenoreceptor antagonists, initial encounter: Secondary | ICD-10-CM | POA: Diagnosis present

## 2016-03-08 DIAGNOSIS — J189 Pneumonia, unspecified organism: Secondary | ICD-10-CM | POA: Diagnosis present

## 2016-03-08 DIAGNOSIS — L97929 Non-pressure chronic ulcer of unspecified part of left lower leg with unspecified severity: Secondary | ICD-10-CM | POA: Diagnosis present

## 2016-03-08 DIAGNOSIS — R490 Dysphonia: Secondary | ICD-10-CM | POA: Diagnosis present

## 2016-03-08 DIAGNOSIS — E871 Hypo-osmolality and hyponatremia: Secondary | ICD-10-CM | POA: Diagnosis present

## 2016-03-08 DIAGNOSIS — I341 Nonrheumatic mitral (valve) prolapse: Secondary | ICD-10-CM | POA: Diagnosis present

## 2016-03-08 DIAGNOSIS — F329 Major depressive disorder, single episode, unspecified: Secondary | ICD-10-CM | POA: Diagnosis present

## 2016-03-08 DIAGNOSIS — E860 Dehydration: Secondary | ICD-10-CM | POA: Diagnosis present

## 2016-03-08 DIAGNOSIS — I4891 Unspecified atrial fibrillation: Secondary | ICD-10-CM

## 2016-03-08 DIAGNOSIS — Y929 Unspecified place or not applicable: Secondary | ICD-10-CM

## 2016-03-08 DIAGNOSIS — Z66 Do not resuscitate: Secondary | ICD-10-CM | POA: Diagnosis present

## 2016-03-08 DIAGNOSIS — J440 Chronic obstructive pulmonary disease with acute lower respiratory infection: Secondary | ICD-10-CM | POA: Diagnosis present

## 2016-03-08 DIAGNOSIS — J209 Acute bronchitis, unspecified: Secondary | ICD-10-CM | POA: Diagnosis present

## 2016-03-08 DIAGNOSIS — F419 Anxiety disorder, unspecified: Secondary | ICD-10-CM | POA: Diagnosis present

## 2016-03-08 DIAGNOSIS — E785 Hyperlipidemia, unspecified: Secondary | ICD-10-CM | POA: Diagnosis present

## 2016-03-08 DIAGNOSIS — Z91128 Patient's intentional underdosing of medication regimen for other reason: Secondary | ICD-10-CM

## 2016-03-08 DIAGNOSIS — I214 Non-ST elevation (NSTEMI) myocardial infarction: Secondary | ICD-10-CM

## 2016-03-08 DIAGNOSIS — J40 Bronchitis, not specified as acute or chronic: Secondary | ICD-10-CM | POA: Diagnosis not present

## 2016-03-08 DIAGNOSIS — Z9181 History of falling: Secondary | ICD-10-CM

## 2016-03-08 DIAGNOSIS — Z7984 Long term (current) use of oral hypoglycemic drugs: Secondary | ICD-10-CM

## 2016-03-08 DIAGNOSIS — Z7983 Long term (current) use of bisphosphonates: Secondary | ICD-10-CM

## 2016-03-08 DIAGNOSIS — Z79891 Long term (current) use of opiate analgesic: Secondary | ICD-10-CM

## 2016-03-08 DIAGNOSIS — R531 Weakness: Secondary | ICD-10-CM

## 2016-03-08 DIAGNOSIS — I252 Old myocardial infarction: Secondary | ICD-10-CM

## 2016-03-08 DIAGNOSIS — R0600 Dyspnea, unspecified: Secondary | ICD-10-CM

## 2016-03-08 DIAGNOSIS — Z87440 Personal history of urinary (tract) infections: Secondary | ICD-10-CM

## 2016-03-08 HISTORY — DX: Urinary tract infection, site not specified: N39.0

## 2016-03-08 HISTORY — DX: Chronic diastolic (congestive) heart failure: I50.32

## 2016-03-08 HISTORY — DX: Age-related osteoporosis without current pathological fracture: M81.0

## 2016-03-08 HISTORY — DX: Nonrheumatic mitral (valve) prolapse: I34.1

## 2016-03-08 HISTORY — DX: Type 2 diabetes mellitus without complications: E11.9

## 2016-03-08 HISTORY — DX: Chronic atrial fibrillation, unspecified: I48.20

## 2016-03-08 HISTORY — DX: Essential (primary) hypertension: I10

## 2016-03-08 HISTORY — DX: Spinal stenosis, site unspecified: M48.00

## 2016-03-08 LAB — COMPREHENSIVE METABOLIC PANEL
ALK PHOS: 155 U/L — AB (ref 38–126)
ALT: 20 U/L (ref 14–54)
AST: 26 U/L (ref 15–41)
Albumin: 3.1 g/dL — ABNORMAL LOW (ref 3.5–5.0)
Anion gap: 9 (ref 5–15)
BUN: 11 mg/dL (ref 6–20)
CALCIUM: 8.8 mg/dL — AB (ref 8.9–10.3)
CO2: 24 mmol/L (ref 22–32)
CREATININE: 0.4 mg/dL — AB (ref 0.44–1.00)
Chloride: 90 mmol/L — ABNORMAL LOW (ref 101–111)
GFR calc Af Amer: >60 mL/min (ref >60)
GFR calc non Af Amer: >60 mL/min (ref >60)
Glucose, Bld: 107 mg/dL — ABNORMAL HIGH (ref 65–99)
Potassium: 3.5 mmol/L (ref 3.5–5.1)
Sodium: 123 mmol/L — ABNORMAL LOW (ref 135–145)
Total Bilirubin: 0.7 mg/dL (ref 0.3–1.2)
Total Protein: 6.4 g/dL — ABNORMAL LOW (ref 6.5–8.1)

## 2016-03-08 LAB — URINALYSIS, ROUTINE W REFLEX MICROSCOPIC
Bilirubin Urine: NEGATIVE
GLUCOSE, UA: NEGATIVE mg/dL
HGB URINE DIPSTICK: NEGATIVE
Ketones, ur: NEGATIVE mg/dL
LEUKOCYTES UA: NEGATIVE
Nitrite: NEGATIVE
PH: 6 (ref 5.0–8.0)
Protein, ur: NEGATIVE mg/dL
Specific Gravity, Urine: 1.009 (ref 1.005–1.030)

## 2016-03-08 LAB — CBC WITH DIFFERENTIAL/PLATELET
BASOS ABS: 0.1 10*3/uL (ref 0–0.1)
BASOS PCT: 0 %
EOS PCT: 0 %
Eosinophils Absolute: 0 10*3/uL (ref 0–0.7)
HEMATOCRIT: 33.9 % — AB (ref 35.0–47.0)
Hemoglobin: 11.7 g/dL — ABNORMAL LOW (ref 12.0–16.0)
LYMPHS PCT: 14 %
Lymphs Abs: 2 10*3/uL (ref 1.0–3.6)
MCH: 27.9 pg (ref 26.0–34.0)
MCHC: 34.5 g/dL (ref 32.0–36.0)
MCV: 81 fL (ref 80.0–100.0)
MONO ABS: 1.4 10*3/uL — AB (ref 0.2–0.9)
MONOS PCT: 10 %
NEUTROS ABS: 10.6 10*3/uL — AB (ref 1.4–6.5)
Neutrophils Relative %: 76 %
PLATELETS: 369 10*3/uL (ref 150–440)
RBC: 4.19 MIL/uL (ref 3.80–5.20)
RDW: 16.3 % — AB (ref 11.5–14.5)
WBC: 14.1 10*3/uL — ABNORMAL HIGH (ref 3.6–11.0)

## 2016-03-08 LAB — CREATININE, SERUM: Creatinine, Ser: 0.42 mg/dL — ABNORMAL LOW (ref 0.44–1.00)

## 2016-03-08 LAB — LACTIC ACID, PLASMA: Lactic Acid, Venous: 1.2 mmol/L (ref 0.5–1.9)

## 2016-03-08 LAB — PROCALCITONIN: Procalcitonin: <0.1 ng/mL

## 2016-03-08 LAB — PROTIME-INR
INR: 1.15
Prothrombin Time: 14.8 seconds (ref 11.4–15.2)

## 2016-03-08 LAB — TROPONIN I: TROPONIN I: 0.03 ng/mL — AB (ref <0.03)

## 2016-03-08 LAB — SODIUM, URINE, RANDOM: Sodium, Ur: <10 mmol/L

## 2016-03-08 LAB — GLUCOSE, CAPILLARY: Glucose-Capillary: 164 mg/dL — ABNORMAL HIGH (ref 65–99)

## 2016-03-08 LAB — BRAIN NATRIURETIC PEPTIDE: B Natriuretic Peptide: 583 pg/mL — ABNORMAL HIGH (ref 0.0–100.0)

## 2016-03-08 LAB — LIPASE, BLOOD: LIPASE: 40 U/L (ref 11–51)

## 2016-03-08 MED ORDER — HEPARIN SODIUM (PORCINE) 5000 UNIT/ML IJ SOLN
5000.0000 [IU] | Freq: Three times a day (TID) | INTRAMUSCULAR | Status: DC
  Administered 2016-03-08 – 2016-03-09 (×2): 5000 [IU] via SUBCUTANEOUS
  Filled 2016-03-08 (×2): qty 1

## 2016-03-08 MED ORDER — ONDANSETRON HCL 4 MG PO TABS: 4.0000 mg | ORAL_TABLET | Freq: Four times a day (QID) | ORAL | Status: DC | PRN

## 2016-03-08 MED ORDER — INSULIN ASPART 100 UNIT/ML ~~LOC~~ SOLN
0.0000 [IU] | Freq: Three times a day (TID) | SUBCUTANEOUS | Status: DC
  Administered 2016-03-09: 2 [IU] via SUBCUTANEOUS
  Administered 2016-03-09: 3 [IU] via SUBCUTANEOUS
  Administered 2016-03-09 – 2016-03-10 (×2): 1 [IU] via SUBCUTANEOUS
  Administered 2016-03-10: 3 [IU] via SUBCUTANEOUS
  Administered 2016-03-10: 9 [IU] via SUBCUTANEOUS
  Administered 2016-03-11: 5 [IU] via SUBCUTANEOUS
  Administered 2016-03-11: 9 [IU] via SUBCUTANEOUS
  Administered 2016-03-11: 3 [IU] via SUBCUTANEOUS
  Administered 2016-03-12 (×2): 2 [IU] via SUBCUTANEOUS
  Administered 2016-03-12: 9 [IU] via SUBCUTANEOUS
  Filled 2016-03-08: qty 9
  Filled 2016-03-08: qty 3
  Filled 2016-03-08: qty 1
  Filled 2016-03-08: qty 2
  Filled 2016-03-08: qty 9
  Filled 2016-03-08: qty 2
  Filled 2016-03-08: qty 5
  Filled 2016-03-08: qty 2
  Filled 2016-03-08: qty 1
  Filled 2016-03-08: qty 3
  Filled 2016-03-08: qty 9
  Filled 2016-03-08: qty 3

## 2016-03-08 MED ORDER — SODIUM CHLORIDE 0.9% FLUSH
3.0000 mL | Freq: Two times a day (BID) | INTRAVENOUS | Status: DC
  Administered 2016-03-08 – 2016-03-12 (×7): 3 mL via INTRAVENOUS

## 2016-03-08 MED ORDER — SODIUM CHLORIDE 0.9 % IV BOLUS (SEPSIS): 1000.0000 mL | Freq: Once | INTRAVENOUS | Status: DC

## 2016-03-08 MED ORDER — ATORVASTATIN CALCIUM 10 MG PO TABS
5.0000 mg | ORAL_TABLET | Freq: Every day | ORAL | Status: DC
  Administered 2016-03-08 – 2016-03-11 (×4): 5 mg via ORAL
  Filled 2016-03-08 (×4): qty 1

## 2016-03-08 MED ORDER — METOPROLOL TARTRATE 50 MG PO TABS
50.0000 mg | ORAL_TABLET | Freq: Once | ORAL | Status: AC
  Administered 2016-03-08: 50 mg via ORAL
  Filled 2016-03-08: qty 1

## 2016-03-08 MED ORDER — ALBUTEROL SULFATE (2.5 MG/3ML) 0.083% IN NEBU: 2.5000 mg | INHALATION_SOLUTION | RESPIRATORY_TRACT | Status: DC | PRN

## 2016-03-08 MED ORDER — POTASSIUM CHLORIDE CRYS ER 20 MEQ PO TBCR: 20.0000 meq | EXTENDED_RELEASE_TABLET | Freq: Every day | ORAL | Status: DC

## 2016-03-08 MED ORDER — INSULIN ASPART 100 UNIT/ML ~~LOC~~ SOLN
0.0000 [IU] | Freq: Every day | SUBCUTANEOUS | Status: DC
  Administered 2016-03-10: 3 [IU] via SUBCUTANEOUS
  Administered 2016-03-11: 4 [IU] via SUBCUTANEOUS
  Filled 2016-03-08: qty 3
  Filled 2016-03-08: qty 4

## 2016-03-08 MED ORDER — DILTIAZEM HCL ER BEADS 240 MG PO CP24
360.0000 mg | ORAL_CAPSULE | Freq: Every day | ORAL | Status: DC
  Administered 2016-03-09 – 2016-03-12 (×4): 360 mg via ORAL
  Filled 2016-03-08 (×8): qty 1

## 2016-03-08 MED ORDER — BUSPIRONE HCL 5 MG PO TABS
7.5000 mg | ORAL_TABLET | Freq: Two times a day (BID) | ORAL | Status: DC
  Administered 2016-03-08 – 2016-03-12 (×8): 7.5 mg via ORAL
  Filled 2016-03-08 (×2): qty 2
  Filled 2016-03-08: qty 1.5
  Filled 2016-03-08: qty 2
  Filled 2016-03-08: qty 1.5
  Filled 2016-03-08 (×3): qty 2
  Filled 2016-03-08: qty 1

## 2016-03-08 MED ORDER — ASPIRIN 81 MG PO CHEW
81.0000 mg | CHEWABLE_TABLET | Freq: Every day | ORAL | Status: DC
  Administered 2016-03-09 – 2016-03-12 (×4): 81 mg via ORAL
  Filled 2016-03-08 (×4): qty 1

## 2016-03-08 MED ORDER — TRAMADOL HCL 50 MG PO TABS
50.0000 mg | ORAL_TABLET | Freq: Four times a day (QID) | ORAL | Status: DC | PRN
  Administered 2016-03-08: 50 mg via ORAL
  Filled 2016-03-08 (×2): qty 1

## 2016-03-08 MED ORDER — QUETIAPINE FUMARATE 25 MG PO TABS
25.0000 mg | ORAL_TABLET | Freq: Every day | ORAL | Status: DC
  Administered 2016-03-08 – 2016-03-11 (×4): 25 mg via ORAL
  Filled 2016-03-08 (×4): qty 1

## 2016-03-08 MED ORDER — SODIUM CHLORIDE 0.9% FLUSH: 3.0000 mL | INTRAVENOUS | Status: DC | PRN

## 2016-03-08 MED ORDER — LISINOPRIL 5 MG PO TABS
2.5000 mg | ORAL_TABLET | Freq: Every day | ORAL | Status: DC
  Administered 2016-03-08: 2.5 mg via ORAL
  Filled 2016-03-08: qty 1

## 2016-03-08 MED ORDER — FUROSEMIDE 10 MG/ML IJ SOLN
40.0000 mg | Freq: Two times a day (BID) | INTRAMUSCULAR | Status: DC
  Administered 2016-03-08 – 2016-03-09 (×2): 40 mg via INTRAVENOUS
  Filled 2016-03-08 (×2): qty 4

## 2016-03-08 MED ORDER — DEXTROSE 5 % IV SOLN
500.0000 mg | INTRAVENOUS | Status: DC
  Administered 2016-03-08 – 2016-03-09 (×2): 500 mg via INTRAVENOUS
  Filled 2016-03-08 (×3): qty 500

## 2016-03-08 MED ORDER — SODIUM CHLORIDE 0.9 % IV SOLN: 250.0000 mL | INTRAVENOUS | Status: DC | PRN

## 2016-03-08 MED ORDER — SODIUM CHLORIDE 0.9 % IV BOLUS (SEPSIS)
500.0000 mL | Freq: Once | INTRAVENOUS | Status: AC
  Administered 2016-03-08: 500 mL via INTRAVENOUS

## 2016-03-08 MED ORDER — ONDANSETRON HCL 4 MG/2ML IJ SOLN: 4.0000 mg | Freq: Four times a day (QID) | INTRAMUSCULAR | Status: DC | PRN

## 2016-03-08 MED ORDER — SODIUM CHLORIDE 0.9 % IV BOLUS (SEPSIS): 500.0000 mL | Freq: Once | INTRAVENOUS | Status: DC

## 2016-03-08 MED ORDER — ENALAPRIL MALEATE 10 MG PO TABS: 10.0000 mg | ORAL_TABLET | Freq: Every day | ORAL | Status: DC

## 2016-03-08 MED ORDER — ACETAMINOPHEN 325 MG PO TABS
650.0000 mg | ORAL_TABLET | Freq: Four times a day (QID) | ORAL | Status: DC | PRN
  Administered 2016-03-09 – 2016-03-12 (×8): 650 mg via ORAL
  Filled 2016-03-08 (×8): qty 2

## 2016-03-08 MED ORDER — GUAIFENESIN-DM 100-10 MG/5ML PO SYRP
5.0000 mL | ORAL_SOLUTION | ORAL | Status: DC | PRN
  Administered 2016-03-08: 5 mL via ORAL
  Filled 2016-03-08: qty 5

## 2016-03-08 MED ORDER — DEXTROSE 5 % IV SOLN: 1.0000 g | INTRAVENOUS | Status: DC

## 2016-03-08 MED ORDER — ACETAMINOPHEN 650 MG RE SUPP: 650.0000 mg | Freq: Four times a day (QID) | RECTAL | Status: DC | PRN

## 2016-03-08 MED ORDER — ALENDRONATE SODIUM 70 MG PO TABS: 70.0000 mg | ORAL_TABLET | Freq: Every day | ORAL | Status: DC

## 2016-03-08 MED ORDER — CEFTRIAXONE SODIUM-DEXTROSE 1-3.74 GM-% IV SOLR
1.0000 g | INTRAVENOUS | Status: DC
  Administered 2016-03-08: 1 g via INTRAVENOUS
  Filled 2016-03-08 (×2): qty 50

## 2016-03-08 MED ORDER — CALCIUM CARBONATE-VITAMIN D 500-200 MG-UNIT PO TABS
1.0000 | ORAL_TABLET | Freq: Every day | ORAL | Status: DC
  Administered 2016-03-08 – 2016-03-12 (×5): 1 via ORAL
  Filled 2016-03-08 (×5): qty 1

## 2016-03-08 MED ORDER — OXYBUTYNIN CHLORIDE ER 5 MG PO TB24
10.0000 mg | ORAL_TABLET | Freq: Every morning | ORAL | Status: DC
  Administered 2016-03-09 – 2016-03-12 (×4): 10 mg via ORAL
  Filled 2016-03-08 (×4): qty 2

## 2016-03-08 MED ORDER — LORAZEPAM 0.5 MG PO TABS
0.2500 mg | ORAL_TABLET | Freq: Four times a day (QID) | ORAL | Status: DC | PRN
  Administered 2016-03-08 – 2016-03-10 (×2): 0.25 mg via ORAL
  Filled 2016-03-08 (×3): qty 1

## 2016-03-08 MED ORDER — RISAQUAD PO CAPS
1.0000 | ORAL_CAPSULE | Freq: Every day | ORAL | Status: DC
  Administered 2016-03-09 – 2016-03-12 (×4): 1 via ORAL
  Filled 2016-03-08 (×5): qty 1

## 2016-03-08 NOTE — H&P (Addendum)
[facility] [place] at [facility]   PATIENT NAME: Terri Barker    MR#:  [id number]  DATE OF BIRTH:  Feb 15, 1927  DATE OF ADMISSION:  03/08/2016  PRIMARY CARE PHYSICIAN: Marletta Lor, NP   REQUESTING/REFERRING PHYSICIAN: Merrily Brittle, MD  CHIEF COMPLAINT:   Chief Complaint  Patient presents with  . Abnormal Labs   Cough, shortness of breath and leg swelling. 3 days. HISTORY OF PRESENT ILLNESS:  Terri Barker  is a 83 y.o. female with a known history of A. fib, CHF, diabetes, hypertension and mitral wall prolapse the patient to present to the ED with the above chief complaints. She has a history of chronic A. fib on Eliquis, which was discontinued by her primary care doctor 2 days ago. She has had worsening cough, shortness of breath and leg swelling for 3 days. She denies any fever or chills. She has tachycardia with heart rate at 120's. Chest x-ray show congestion and bilateral basilar atelectasis or infiltrate. Sodium is low at 123. She saw her cardiologist yesterday, who increased her Lasix dose from 10 mg to 20 mg daily.  PAST MEDICAL HISTORY:   Past Medical History:  Diagnosis Date  . A-fib (HCC)   . CHF (congestive heart failure) (HCC)   . Diabetes mellitus without complication (HCC)   . Hypertension   . Mitral valve prolapse   . Osteoporosis   . Recurrent UTI   . Spinal stenosis     PAST SURGICAL HISTORY:  History reviewed. No pertinent surgical history.  SOCIAL HISTORY:   Social History  Substance Use Topics  . Smoking status: Former Games developer  . Smokeless tobacco: Never Used  . Alcohol use No    FAMILY HISTORY:  No family history on file.  DRUG ALLERGIES:   Allergies  Allergen Reactions  . Codeine   . Feldene [Piroxicam]   . Sporanox [Itraconazole]   . Sulfa Antibiotics     REVIEW OF SYSTEMS:   Review of Systems  Constitutional: Positive for malaise/fatigue. Negative for chills and fever.  HENT: Positive for hearing loss.  Negative for congestion.   Eyes: Negative for blurred vision and double vision.  Respiratory: Positive for cough, sputum production, shortness of breath and wheezing. Negative for hemoptysis and stridor.   Cardiovascular: Positive for orthopnea and leg swelling. Negative for chest pain and palpitations.  Gastrointestinal: Negative for abdominal pain, blood in stool, constipation, diarrhea, nausea and vomiting.  Genitourinary: Negative for dysuria and hematuria.  Musculoskeletal: Negative for back pain and joint pain.  Skin: Negative for itching and rash.  Neurological: Positive for weakness. Negative for dizziness, focal weakness, loss of consciousness and headaches.  Psychiatric/Behavioral: Negative for depression. The patient is not nervous/anxious.     MEDICATIONS AT HOME:   Prior to Admission medications   Medication Sig Start Date End Date Taking? Authorizing Provider  acetaminophen (TYLENOL 8 HOUR) 650 MG CR tablet Take 650 mg by mouth every 8 (eight) hours as needed for pain.   Yes Historical Provider, MD  aspirin 81 MG chewable tablet Chew 81 mg by mouth daily.   Yes Historical Provider, MD  atorvastatin (LIPITOR) 10 MG tablet Take 5 mg by mouth at bedtime.    Yes Historical Provider, MD  B Complex-C-E-Zn (B COMPLEX-C-E-ZINC) tablet Take 1 tablet by mouth daily.   Yes Historical Provider, MD  busPIRone (BUSPAR) 7.5 MG tablet Take 7.5 mg by mouth 2 (two) times daily.   Yes Historical Provider, MD  Calcium-Vitamins C & D (CALCIUM/C/D  PO) Take 1 tablet by mouth daily.   Yes Historical Provider, MD  Cholecalciferol (VITAMIN D3) 1000 units CAPS Take 1,000 Units by mouth daily.   Yes Historical Provider, MD  Coenzyme Q10 (CO Q 10) 100 MG CAPS Take 100 mg by mouth at bedtime.   Yes Historical Provider, MD  CRANBERRY PO Take 1 tablet by mouth daily.   Yes Historical Provider, MD  diltiazem (TIAZAC) 360 MG 24 hr capsule Take 360 mg by mouth daily.   Yes Historical Provider, MD  enalapril  (VASOTEC) 10 MG tablet Take 10 mg by mouth daily.   Yes Historical Provider, MD  furosemide (LASIX) 20 MG tablet Take 1 tablet (20 mg total) by mouth daily. 03/07/16 06/05/16 Yes Runell Gess, MD  furosemide (LASIX) 40 MG tablet Take 40 mg by mouth daily. 03/08/16 03/10/16 Yes Historical Provider, MD  glimepiride (AMARYL) 1 MG tablet Take 1 mg by mouth 2 (two) times daily.   Yes Historical Provider, MD  LORazepam (ATIVAN) 0.5 MG tablet Take 0.25 mg by mouth every 6 (six) hours as needed for anxiety.    Yes Historical Provider, MD  Multiple Vitamin (MULTIVITAMIN) tablet Take 1 tablet by mouth daily.   Yes Historical Provider, MD  omega-3 acid ethyl esters (LOVAZA) 1 g capsule Take 1 g by mouth daily.   Yes Historical Provider, MD  oxybutynin (DITROPAN-XL) 10 MG 24 hr tablet Take 10 mg by mouth every morning.   Yes Historical Provider, MD  potassium chloride SA (K-DUR,KLOR-CON) 20 MEQ tablet Take 20 mEq by mouth daily.   Yes Historical Provider, MD  Probiotic Product (PROBIOTIC DAILY PO) Take 1 tablet by mouth daily.   Yes Historical Provider, MD  QUEtiapine (SEROQUEL) 25 MG tablet Take 25 mg by mouth at bedtime.   Yes Historical Provider, MD  traMADol (ULTRAM) 50 MG tablet Take 50 mg by mouth every 6 (six) hours as needed.   Yes Historical Provider, MD  alendronate (FOSAMAX) 70 MG tablet Take 70 mg by mouth daily. Take with a full glass of water on an empty stomach.    Historical Provider, MD      VITAL SIGNS:  Blood pressure 118/66, pulse 90, temperature 98.2 F (36.8 C), temperature source Oral, resp. rate (!) 24, height 5\' 3"  (1.6 m), weight 126 lb (57.2 kg), SpO2 95 %.  PHYSICAL EXAMINATION:  Physical Exam  GENERAL:  83 y.o.-year-old patient lying in the bed with no acute distress.  EYES: Pupils equal, round, reactive to light and accommodation. No scleral icterus. Extraocular muscles intact.  HEENT: Head atraumatic, normocephalic. Oropharynx and nasopharynx clear.  NECK:  Supple, no  jugular venous distention. No thyroid enlargement, no tenderness.  LUNGS: Normal breath sounds bilaterally, Bilateral wheezing and rales. No use of accessory muscles of respiration.  CARDIOVASCULAR: S1, S2 normal. No murmurs, rubs, or gallops.  ABDOMEN: Soft, nontender, nondistended. Bowel sounds present. No organomegaly or mass.  EXTREMITIES: Bilateral leg edema 2+, no cyanosis, or clubbing.  NEUROLOGIC: Cranial nerves II through XII are intact. Muscle strength 3-4/5 in all extremities. Sensation intact. Gait not checked.  PSYCHIATRIC: The patient is alert and oriented x 3.  SKIN: No obvious rash, lesion, or ulcer.   LABORATORY PANEL:   CBC  Recent Labs Lab 03/08/16 1534  WBC 14.1*  HGB 11.7*  HCT 33.9*  PLT 369   ------------------------------------------------------------------------------------------------------------------  Chemistries   Recent Labs Lab 03/08/16 1534  NA 123*  K 3.5  CL 90*  CO2 24  GLUCOSE 107*  BUN 11  CREATININE 0.40*  CALCIUM 8.8*  AST 26  ALT 20  ALKPHOS 155*  BILITOT 0.7   ------------------------------------------------------------------------------------------------------------------  Cardiac Enzymes  Recent Labs Lab 03/08/16 1534  TROPONINI 0.03*   ------------------------------------------------------------------------------------------------------------------  RADIOLOGY:  Dg Chest Port 1 View  Result Date: 03/08/2016 CLINICAL DATA:  Hypertension, diabetes and CHF. EXAM: PORTABLE CHEST 1 VIEW COMPARISON:  None. FINDINGS: The heart is enlarged. There is moderate tortuosity and dense calcification of the thoracic aorta. Small bilateral pleural effusions and bibasilar infiltrates or atelectasis. No pulmonary edema or pneumothorax. The bony thorax is intact. Calcifications are noted around both shoulder joints. IMPRESSION: Cardiac enlargement. Small effusions, left greater than right and bibasilar infiltrates or atelectasis.  Electronically Signed   By: Rudie MeyerP.  Gallerani M.D.   On: 03/08/2016 15:25      IMPRESSION AND PLAN:   Acute on chronic congestive heart failure, unknown type. Start IV Lasix twice a day, CHF protocol, echocardiogram and cardiology consult.   CAP pneumonia with leukocytosis. Start Zithromax and Rocephin, follow-up CBC, blood culture and sputum culture.  A. fib with RVR. Continue Cardizem, continue aspirin, defer anticoagulation recommendation to cardiologist, follow-up cardiology consult.  Hyponatremia. Possible due to CHF. Follow-up BMP.  Diabetes. Hold glipizide and start a sliding scale.  All the records are reviewed and case discussed with ED provider. Management plans discussed with the patient,  her granddaughter (POA) and they are in agreement.  CODE STATUS: DO NOT RESUSCITATE  TOTAL TIME TAKING CARE OF THIS PATIENT: 56 minutes.    Shaune Pollackhen, Dontrell Stuck M.D on 03/08/2016 at 6:50 PM  Between 7am to 6pm - Pager - [phone]  After 6pm go to [email or web address] - Social research officer, governmentpassword [email or web address]  [facility] [place] [facility]  Office  [phone]  CC: Primary care physician; Marletta LorBarr, Julie, NP   Note: This dictation was prepared with Dragon dictation along with smaller phrase technology. Any transcriptional errors that result from this process are unintentional.

## 2016-03-08 NOTE — ED Notes (Signed)
Pt placed on bed pan and pts family advised that she would call when she was done due to pt not knowing when she has to go

## 2016-03-08 NOTE — ED Notes (Signed)
Patient transported to 2A

## 2016-03-08 NOTE — ED Provider Notes (Signed)
[facility] Emergency Department Provider Note  ____________________________________________   First MD Initiated Contact with Patient 03/08/16 1505     (approximate)  I have reviewed the triage vital signs and the nursing notes.   HISTORY  Chief Complaint Abnormal Labs    HPI Terri Barker is a 83 y.o. female who is sent to the emergency department via EMS from an unknown outside physician for abnormal labs. The patient is unsure which labs were abnormal. We were supposed to have labs faxed to [place] by the physician however only the CBC came through with a white count of 17.3. The patient is from [place] but has recently moved to [place] has been getting care for her chronic wound to her right lower extremity for the past month. She denies fevers or chills. She does report 1 day of increasingly productive cough along with hoarse voice sore throat and mild shortness of breath. She would not have come to the hospital had she not been advised to come in for abnormal labs.   03/07/16 Cards note:  Critical lower limb ischemia Ms. Mcgaugh was referred by Dr. Meyer Russel at the wound care center for evaluation of critical limb ischemia. He recently moved from syncope. She moved to [place] in late January. She does have chronic atrial fibrillation and was on oral anticoagulation up until yesterday. She has bilateral lower extremity edema with venous stasis changes and nonhealing ulcers on her pretibial areas and care of consistent with venous insufficiency ulcers. She has no wounds on her foot. I do feel pedal pulses. I'm going to increase her Lasix from 10-20 mg a day. We will get lower extremity arterial Doppler studies. I will see her back in 3 months.  Chronic atrial fibrillation (HCC) Long history of chronic atrial fibrillation on metoprolol and Cardizem as well as Eliquis. Her metoprolol and Eliquis were discontinued by her PCP. Her heart rate at home is in the  60s and 70s on Cardizem.  Essential hypertension History of essential hypertension blood pressure measured 130/62. She is on Cardizem, and enalapril. Continue current meds at current dosing  Hyperlipidemia History of hyperlipidemia on atorvastatin followed by her PCP  Chronic diastolic heart failure (HCC) History of chronic diastolic heart failure or low-dose diuretics. She does have 2-3+ pitting edema. I'm going to increase her Lasix from 10-20 mg a day and will recheck electrolytes. She has had a 2-D echo in the past which showed normal LV systolic function.   Past Medical History:  Diagnosis Date  . A-fib (HCC)   . CHF (congestive heart failure) (HCC)   . Diabetes mellitus without complication (HCC)   . Hypertension   . Mitral valve prolapse   . Osteoporosis   . Recurrent UTI   . Spinal stenosis     Patient Active Problem List   Diagnosis Date Noted  . Critical lower limb ischemia 03/07/2016  . Chronic atrial fibrillation (HCC) 03/07/2016  . Hyperlipidemia 03/07/2016  . Type 1 diabetes mellitus with complications (HCC) 03/07/2016  . Bilateral lower extremity edema 03/07/2016  . Venous stasis dermatitis of both lower extremities 03/07/2016  . Essential hypertension 03/07/2016  . Chronic diastolic heart failure (HCC) 03/07/2016    History reviewed. No pertinent surgical history.  Prior to Admission medications   Medication Sig Start Date End Date Taking? Authorizing Provider  acetaminophen (TYLENOL 8 HOUR) 650 MG CR tablet Take 650 mg by mouth every 8 (eight) hours as needed for pain.    Historical Provider, MD  alendronate (FOSAMAX) 70 MG tablet Take 70 mg by mouth daily. Take with a full glass of water on an empty stomach.    Historical Provider, MD  aspirin 81 MG chewable tablet Chew 81 mg by mouth daily.    Historical Provider, MD  atorvastatin (LIPITOR) 10 MG tablet Take 0.5 mg by mouth at bedtime.    Historical Provider, MD  B Complex-C-E-Zn (B COMPLEX-C-E-ZINC)  tablet Take 1 tablet by mouth daily.    Historical Provider, MD  busPIRone (BUSPAR) 7.5 MG tablet Take 7.5 mg by mouth 2 (two) times daily.    Historical Provider, MD  Calcium-Vitamins C & D (CALCIUM/C/D PO) Take 1 tablet by mouth daily.    Historical Provider, MD  Cholecalciferol (VITAMIN D3) 1000 units CAPS Take 1,000 Units by mouth daily.    Historical Provider, MD  Coenzyme Q10 (CO Q 10) 100 MG CAPS Take 100 mg by mouth at bedtime.    Historical Provider, MD  CRANBERRY PO Take 1 tablet by mouth daily.    Historical Provider, MD  diltiazem (TIAZAC) 360 MG 24 hr capsule Take 360 mg by mouth daily.    Historical Provider, MD  enalapril (VASOTEC) 10 MG tablet Take 10 mg by mouth daily.    Historical Provider, MD  furosemide (LASIX) 20 MG tablet Take 1 tablet (20 mg total) by mouth daily. 03/07/16 06/05/16  Runell GessJonathan J Berry, MD  glimepiride (AMARYL) 1 MG tablet Take 1 mg by mouth 2 (two) times daily.    Historical Provider, MD  LORazepam (ATIVAN) 0.5 MG tablet Take 0.5 mg by mouth as needed for anxiety.    Historical Provider, MD  Multiple Vitamin (MULTIVITAMIN) tablet Take 1 tablet by mouth daily.    Historical Provider, MD  omega-3 acid ethyl esters (LOVAZA) 1 g capsule Take 1 g by mouth daily.    Historical Provider, MD  potassium chloride SA (K-DUR,KLOR-CON) 20 MEQ tablet Take 20 mEq by mouth daily.    Historical Provider, MD  Probiotic Product (PROBIOTIC DAILY PO) Take 1 tablet by mouth daily.    Historical Provider, MD  traMADol (ULTRAM) 50 MG tablet Take 50 mg by mouth every 6 (six) hours as needed.    Historical Provider, MD  traZODone (DESYREL) 50 MG tablet Take 50 mg by mouth at bedtime.    Historical Provider, MD    Allergies Codeine; Feldene [piroxicam]; Sporanox [itraconazole]; and Sulfa antibiotics  No family history on file.  Social History Social History  Substance Use Topics  . Smoking status: Former Games developermoker  . Smokeless tobacco: Never Used  . Alcohol use No    Review of  Systems Constitutional: No fever/chills Eyes: No visual changes. ENT: Positive sore throat. Cardiovascular: Denies chest pain. Respiratory: Positive shortness of breath. Gastrointestinal: No abdominal pain.  No nausea, no vomiting.  No diarrhea.  No constipation. Genitourinary: Negative for dysuria. Musculoskeletal: Negative for back pain. Positive for wound Skin: Negative for rash. Neurological: Negative for headaches, focal weakness or numbness.  10-point ROS otherwise negative.  ____________________________________________   PHYSICAL EXAM:  VITAL SIGNS: ED Triage Vitals  Enc Vitals Group     BP      Pulse      Resp      Temp      Temp src      SpO2      Weight      Height      Head Circumference      Peak Flow      Pain Score  Pain Loc      Pain Edu?      Excl. in GC?     Constitutional: Alert and oriented x 4 well appearing nontoxic no diaphoresis speaks in fullSentences with hoarse voice Eyes: PERRL EOMI. Head: Atraumatic. Nose: No congestion/rhinnorhea. Mouth/Throat: No trismus uvula midline no pharyngeal erythema or exudate no tenderness over trachea Neck: No stridor.   Cardiovascular: Tachycardic and irregularly irregular Grossly normal heart sounds.  Good peripheral circulation. Respiratory: Normal respiratory effort.  No retractions. Lungs CTAB and moving good air Gastrointestinal: Soft nondistended nontender no rebound no guarding no peritonitis no McBurney's tenderness negative Rovsing's no costovertebral tenderness negative Murphy's Musculoskeletal: 2+ pitting edema bilateral lower extremities equal bilaterally chronic appearing wound to right lower extremity no erythema or warmth tenderness or purulent discharge. Palpable pulses in bilateral lower extremities 1+ DP Neurologic:  Normal speech and language. No gross focal neurologic deficits are appreciated. Skin:  Skin is warm, dry and intact. No rash noted. Psychiatric: Mood and affect are normal.  Speech and behavior are normal.  ____________________________________________   LABS (all labs ordered are listed, but only abnormal results are displayed)  Labs Reviewed  COMPREHENSIVE METABOLIC PANEL - Abnormal; Notable for the following:       Result Value   Sodium 123 (*)    Chloride 90 (*)    Glucose, Bld 107 (*)    Creatinine, Ser 0.40 (*)    Calcium 8.8 (*)    Total Protein 6.4 (*)    Albumin 3.1 (*)    Alkaline Phosphatase 155 (*)    All other components within normal limits  TROPONIN I - Abnormal; Notable for the following:    Troponin I 0.03 (*)    All other components within normal limits  CBC WITH DIFFERENTIAL/PLATELET - Abnormal; Notable for the following:    WBC 14.1 (*)    Hemoglobin 11.7 (*)    HCT 33.9 (*)    RDW 16.3 (*)    Neutro Abs 10.6 (*)    Monocytes Absolute 1.4 (*)    All other components within normal limits  URINALYSIS, ROUTINE W REFLEX MICROSCOPIC - Abnormal; Notable for the following:    Color, Urine YELLOW (*)    APPearance CLEAR (*)    All other components within normal limits  BRAIN NATRIURETIC PEPTIDE - Abnormal; Notable for the following:    B Natriuretic Peptide 583.0 (*)    All other components within normal limits  URINE CULTURE  LACTIC ACID, PLASMA  LIPASE, BLOOD  PROTIME-INR  LACTIC ACID, PLASMA  PROCALCITONIN  SODIUM, URINE, RANDOM   _______Sodium 123 chloride 90 anion gap 9 _____________________________________  EKG  ED ECG REPORT I, Merrily Brittle, the attending physician, personally viewed and interpreted this ECG.  Date: 03/08/2016 Rate: 120 Rhythm: Atrial fibrillation with rapid ventricular response QRS Axis: normal Intervals: normal ST/T Wave abnormalities: normal Conduction Disturbances: none Narrative Interpretation: No R-wave progression no signs of acute ischemia abnormal EKG  ____________________________________________  RADIOLOGY  Mild fluid  overload ____________________________________________   PROCEDURES  Procedure(s) performed: no  Procedures  Critical Care performed: yes  CRITICAL CARE Performed by: Merrily Brittle   Total critical care time: 32 minutes  Critical care time was exclusive of separately billable procedures and treating other patients.  Critical care was necessary to treat or prevent imminent or life-threatening deterioration.  Critical care was time spent personally by me on the following activities: development of treatment plan with patient and/or surrogate as well as nursing, discussions with consultants, evaluation  of patient's response to treatment, examination of patient, obtaining history from patient or surrogate, ordering and performing treatments and interventions, ordering and review of laboratory studies, ordering and review of radiographic studies, pulse oximetry and re-evaluation of patient's condition.   ____________________________________________   INITIAL IMPRESSION / ASSESSMENT AND PLAN / ED COURSE  Pertinent labs & imaging results that were available during my care of the patient were reviewed by me and considered in my medical decision making (see chart for details).  On arrival the patient is slightly tachycardic with a white sounding cough and a hoarse voice. Unclear what all the abnormal labs were to require EMS transport so we will repeat her labs here today. The patient did have her Lasix increased outpatient from 10 mg a day to 20 mg a day. As the patient has known diastolic heart failure and is afebrile I will defer fluids at this time.    ----------------------------------------- 3:22 PM on 03/08/2016 -----------------------------------------  Fax just came through and the patient's sodium is 122 and her chloride is 81. His may be secondary to overdiuresis and dehydration so we will give her 500cc of fluid now.  ----------------------------------------- 4:49 PM on  03/08/2016 -----------------------------------------  Urine sodium is pending, however specific gravity is quite low suggestive of possible hypervolemic hyponatremia.  ----------------------------------------- 5:22 PM on 03/08/2016 -----------------------------------------  The patient stopped her metoprolol 2 days ago which likely explains her elevated heart rate. Her EKG is nonischemic and her troponin elevation is likely a type II and STEMI. I will restart her oral metoprolol now and she requires inpatient admission. I discussed the case with the on-call hospitalist who is graciously agreed to admit the patient to his service.   ____________________________________________   FINAL CLINICAL IMPRESSION(S) / ED DIAGNOSES  Final diagnoses:  Hyponatremia      NEW MEDICATIONS STARTED DURING THIS VISIT:  New Prescriptions   No medications on file     Note:  This document was prepared using Dragon voice recognition software and may include unintentional dictation errors.     Merrily Brittle, MD 03/08/16 1736

## 2016-03-08 NOTE — ED Triage Notes (Signed)
[facility]. Reports she fell approximately 1 month ago and is being treated for wounds at a wound clinic. Per EMS, patient was called by MD regarding abnormal labs and told to come to ED. Reports WBC of 17 and Sodium of 122. Patient reports lower back pain only but states this is chronic.

## 2016-03-08 NOTE — Addendum Note (Signed)
Addended by: Raelyn NumberWILLIAMSON, Kizzy Olafson L on: 03/08/2016 05:11 PM   Modules accepted: Orders

## 2016-03-08 NOTE — Progress Notes (Signed)
Advanced Care Plan.  Purpose of Encounter: CODE STATUS. Parties in Attendance: The patient, her 2 granddaughters, me. Patient's Decisional Capacity: Yes. Subjective/Patient Story: The patient presented the ED with cough, shortness of breath and leg swelling. Objective/Medical Story: Currently, she has acute on chronic CHF, A. fib with RVR, pneumonia and hyponatremia. She has poor prognosis.  Goals of Care Determinations: DO NOT RESUSCITATE. Plan:  Code Status: DO NOT RESUSCITATE.  Time spent discussing advance care planning 17 minutes.

## 2016-03-08 NOTE — Progress Notes (Signed)

## 2016-03-09 ENCOUNTER — Inpatient Hospital Stay (HOSPITAL_COMMUNITY)
Admit: 2016-03-09 | Discharge: 2016-03-09 | Disposition: A | Discharge status: Home / Self Care | Payer: Medicare Other | Attending: Internal Medicine | Admitting: Internal Medicine

## 2016-03-09 ENCOUNTER — Encounter: Payer: Self-pay | Admitting: Nurse Practitioner

## 2016-03-09 DIAGNOSIS — I5033 Acute on chronic diastolic (congestive) heart failure: Secondary | ICD-10-CM

## 2016-03-09 DIAGNOSIS — I482 Chronic atrial fibrillation: Secondary | ICD-10-CM

## 2016-03-09 DIAGNOSIS — I5031 Acute diastolic (congestive) heart failure: Secondary | ICD-10-CM

## 2016-03-09 LAB — BASIC METABOLIC PANEL
Anion gap: 10 (ref 5–15)
BUN: 8 mg/dL (ref 6–20)
CALCIUM: 9.1 mg/dL (ref 8.9–10.3)
CO2: 28 mmol/L (ref 22–32)
CREATININE: 0.51 mg/dL (ref 0.44–1.00)
Chloride: 91 mmol/L — ABNORMAL LOW (ref 101–111)
GLUCOSE: 122 mg/dL — AB (ref 65–99)
Potassium: 3.2 mmol/L — ABNORMAL LOW (ref 3.5–5.1)
Sodium: 129 mmol/L — ABNORMAL LOW (ref 135–145)

## 2016-03-09 LAB — UREA NITROGEN, URINE: Urea Nitrogen, Ur: 399 mg/dL

## 2016-03-09 LAB — ECHOCARDIOGRAM COMPLETE
Height: 63 in
WEIGHTICAEL: 1889.6 [oz_av]

## 2016-03-09 LAB — GLUCOSE, CAPILLARY
GLUCOSE-CAPILLARY: 133 mg/dL — AB (ref 65–99)
GLUCOSE-CAPILLARY: 218 mg/dL — AB (ref 65–99)
Glucose-Capillary: 170 mg/dL — ABNORMAL HIGH (ref 65–99)
Glucose-Capillary: 111 mg/dL — ABNORMAL HIGH (ref 65–99)

## 2016-03-09 LAB — CBC
HCT: 35.7 % (ref 35.0–47.0)
Hemoglobin: 11.8 g/dL — ABNORMAL LOW (ref 12.0–16.0)
MCH: 27 pg (ref 26.0–34.0)
MCHC: 33.1 g/dL (ref 32.0–36.0)
MCV: 81.7 fL (ref 80.0–100.0)
PLATELETS: 376 10*3/uL (ref 150–440)
RBC: 4.37 MIL/uL (ref 3.80–5.20)
RDW: 16.2 % — ABNORMAL HIGH (ref 11.5–14.5)
WBC: 14.1 10*3/uL — ABNORMAL HIGH (ref 3.6–11.0)

## 2016-03-09 LAB — HEMOGLOBIN A1C
Hgb A1c MFr Bld: 6.1 % — ABNORMAL HIGH (ref 4.8–5.6)
Mean Plasma Glucose: 128 mg/dL

## 2016-03-09 MED ORDER — BENZONATATE 100 MG PO CAPS
100.0000 mg | ORAL_CAPSULE | Freq: Three times a day (TID) | ORAL | Status: DC
  Administered 2016-03-09 – 2016-03-12 (×11): 100 mg via ORAL
  Filled 2016-03-09 (×12): qty 1

## 2016-03-09 MED ORDER — METOPROLOL TARTRATE 25 MG PO TABS: 25.0000 mg | ORAL_TABLET | Freq: Two times a day (BID) | ORAL | Status: DC

## 2016-03-09 MED ORDER — METOPROLOL TARTRATE 25 MG PO TABS
25.0000 mg | ORAL_TABLET | Freq: Two times a day (BID) | ORAL | Status: DC
  Administered 2016-03-09 – 2016-03-12 (×7): 25 mg via ORAL
  Filled 2016-03-09 (×7): qty 1

## 2016-03-09 MED ORDER — CEPASTAT 14.5 MG MT LOZG
1.0000 | LOZENGE | OROMUCOSAL | Status: DC | PRN
  Administered 2016-03-09 – 2016-03-12 (×3): 1 via BUCCAL
  Filled 2016-03-09 (×2): qty 9

## 2016-03-09 MED ORDER — FUROSEMIDE 40 MG PO TABS
40.0000 mg | ORAL_TABLET | Freq: Every day | ORAL | Status: DC
  Administered 2016-03-09 – 2016-03-12 (×4): 40 mg via ORAL
  Filled 2016-03-09 (×4): qty 1

## 2016-03-09 MED ORDER — ENOXAPARIN SODIUM 40 MG/0.4ML ~~LOC~~ SOLN: 40.0000 mg | SUBCUTANEOUS | Status: DC

## 2016-03-09 MED ORDER — APIXABAN 2.5 MG PO TABS
2.5000 mg | ORAL_TABLET | Freq: Two times a day (BID) | ORAL | Status: DC
  Administered 2016-03-09: 2.5 mg via ORAL
  Filled 2016-03-09: qty 1

## 2016-03-09 MED ORDER — ENSURE ENLIVE PO LIQD
237.0000 mL | ORAL | Status: DC
  Administered 2016-03-10 – 2016-03-11 (×2): 237 mL via ORAL

## 2016-03-09 MED ORDER — ENOXAPARIN SODIUM 40 MG/0.4ML ~~LOC~~ SOLN
40.0000 mg | SUBCUTANEOUS | Status: DC
  Administered 2016-03-09 – 2016-03-11 (×3): 40 mg via SUBCUTANEOUS
  Filled 2016-03-09 (×3): qty 0.4

## 2016-03-09 MED ORDER — GUAIFENESIN ER 600 MG PO TB12
600.0000 mg | ORAL_TABLET | Freq: Two times a day (BID) | ORAL | Status: DC
  Administered 2016-03-09 – 2016-03-12 (×7): 600 mg via ORAL
  Filled 2016-03-09 (×7): qty 1

## 2016-03-09 MED ORDER — POTASSIUM CHLORIDE CRYS ER 20 MEQ PO TBCR
20.0000 meq | EXTENDED_RELEASE_TABLET | Freq: Two times a day (BID) | ORAL | Status: DC
  Administered 2016-03-09 – 2016-03-12 (×7): 20 meq via ORAL
  Filled 2016-03-09 (×7): qty 1

## 2016-03-09 MED ORDER — DEXTROSE 5 % IV SOLN
1.0000 g | INTRAVENOUS | Status: DC
  Administered 2016-03-09: 1 g via INTRAVENOUS
  Filled 2016-03-09 (×2): qty 10

## 2016-03-09 NOTE — Progress Notes (Signed)
ANTICOAGULATION CONSULT NOTE - Initial Consult  Pharmacy Consult for apixaban Indication: atrial fibrillation  Allergies  Allergen Reactions  . Codeine   . Feldene [Piroxicam]   . Sporanox [Itraconazole]   . Sulfa Antibiotics     Patient Measurements: Height: 5\' 3"  (160 cm) Weight: 118 lb 1.6 oz (53.6 kg) IBW/kg (Calculated) : 52.4  Vital Signs: Temp: 98.2 F (36.8 C) (03/02 0730) Temp Source: Oral (03/02 0730) BP: 125/70 (03/02 0730) Pulse Rate: 112 (03/02 0730)  Labs:  Recent Labs  03/08/16 1534 03/08/16 2134 03/09/16 0631  HGB 11.7*  --  11.8*  HCT 33.9*  --  35.7  PLT 369  --  376  LABPROT 14.8  --   --   INR 1.15  --   --   CREATININE 0.40* 0.42* 0.51  TROPONINI 0.03*  --   --     Estimated Creatinine Clearance: 41 mL/min (by C-G formula based on SCr of 0.51 mg/dL).   Medical History: Past Medical History:  Diagnosis Date  . A-fib (HCC)   . CHF (congestive heart failure) (HCC)   . Diabetes mellitus without complication (HCC)   . Hypertension   . Mitral valve prolapse   . Osteoporosis   . Recurrent UTI   . Spinal stenosis     Assessment: Pharmacy consulted to dose apixaban in this 83 year old female who was taking apixaban prior to admission for atrial fibrillation.  Plan:  Order apixaban 2.5 mg PO BID given age 70>80 and Wt < 60 kg. CBC and Plt to be checked at least every 72 hours per protocol.  Cindi CarbonMary M Virdie Penning, PharmD, BCPS Clinical Pharmacist 03/09/2016,8:15 AM

## 2016-03-09 NOTE — Progress Notes (Signed)
*  PRELIMINARY RESULTS* Echocardiogram 2D Echocardiogram has been performed.  Cristela BlueHege, Mikah Poss 03/09/2016, 11:10 AM

## 2016-03-09 NOTE — Care Management (Addendum)
Patient 's current oxygen requirement is acute.  She is very hoarse and short of breath which is limiting ability to speak.  She denies having any services currently in her home.  She is being treated for heart failure and pneumonia. Has been on chronic Eliquis for atrial fib.  Lives with her grand daughter.  Says that before this illness she was able to ambulate without difficulty but this may not be an accurate assessment.  It is reported she has had falls at home in the recent past.  No issues obtaining medications.  Will benefit from home health services.  have request order for physical therapy.  It is much too early in the stay to perform home 02 assessment in the event she would discharge over the weekend.

## 2016-03-09 NOTE — Consult Note (Signed)
Cardiology Consult    Patient ID: Terri Barker MRN: [id number], DOB/AGE: [birth year]   Admit date: 03/08/2016 Date of Consult: 03/09/2016  Primary Physician: Marletta Lor, NP Primary Cardiologist: Erlene Quan, MD  Requesting Provider: Henreitta Leber, MD  Patient Profile    83-year-old female with a history of chronic atrial fibrillation, diastolic CHF, hypertension, and diabetes, who is admitted to [facility] on March 1, with respiratory failure.  Past Medical History   Past Medical History:  Diagnosis Date  . Chronic atrial fibrillation (HCC)    a. CHA2DS2VASc = 6-->prev on eliquis but d/c'd 02/2016 in the setting of falls.  . Chronic diastolic CHF (congestive heart failure) (HCC)    a. reportedly nl EF on echo in the past.  . Diabetes mellitus without complication (HCC)   . Hypertension   . Mitral valve prolapse   . Osteoporosis   . Recurrent UTI   . Spinal stenosis     History reviewed. No pertinent surgical history.   Allergies  Allergies  Allergen Reactions  . Codeine   . Feldene [Piroxicam]   . Sporanox [Itraconazole]   . Sulfa Antibiotics     History of Present Illness    83 year old female with the above past medical history including chronic atrial fibrillation, hypertension, diastolic CHF, diabetes, and recurrent urinary tract infection.she was recently evaluated by Dr. Allyson Sabal on February 28 to establish local cardiologist care, after moving to the [place] area from [place] in January 2018 to be closer to family.  She had previously been on eliquis therapy but this was apparently d/c'd earlier this week in the setting of recent falls.  During her visit with Dr. Allyson Sabal, she was noted to have significant lower extremity edema in the setting of venous stasis. There is some question as to whether or not peripheral arterial disease may be playing a role. Her Lasix was increased from 10 mg daily to and arrangements were made for outpatient lower extremity arterial  Dopplers.  Patient says that on March 1, she developed a cough. She went to her doctor's office and was apparently short of breath. Apparently labs were abnormal.  She was taken to [facility] ED where she was found to be mildly tachycardic. Sodium was 122 with a chloride of 81. She was given a 500 mL saline bolus.  Leukocytosis was noted.  BNP was mod elevated @ 583.  Trop was minimally elevated @ 0.03.  CXR showed small bilat effusions w/ bibasilar infiltrates vs atx.  She was admitted by medicine and placed on IV lasix and zithromax/rocephin.  This morning she remains very congested w/ cough and rhonchi.  Able to lie flat.  Lower ext swelling better but still dyspneic with talking.  No chest pain.  Inpatient Medications    . acidophilus  1 capsule Oral Daily  . apixaban  2.5 mg Oral BID  . aspirin  81 mg Oral Daily  . atorvastatin  5 mg Oral QHS  . azithromycin  500 mg Intravenous Q24H  . busPIRone  7.5 mg Oral BID  . calcium-vitamin D  1 tablet Oral Daily  . cefTRIAXone (ROCEPHIN) IVPB 1 gram/50 mL D5W  1 g Intravenous Q24H  . diltiazem  360 mg Oral Daily  . furosemide  40 mg Intravenous Q12H  . insulin aspart  0-5 Units Subcutaneous QHS  . insulin aspart  0-9 Units Subcutaneous TID WC  . metoprolol tartrate  25 mg Oral BID  . oxybutynin  10 mg Oral q morning -  10a  . potassium chloride SA  20 mEq Oral BID  . QUEtiapine  25 mg Oral QHS  . sodium chloride flush  3 mL Intravenous Q12H    Family History    Negative for premature CAD.  Social History    Social History   Social History  . Marital status: Unknown    Spouse name: N/A  . Number of children: N/A  . Years of education: N/A   Occupational History  . Not on file.   Social History Main Topics  . Smoking status: Former Smoker    Types: Cigarettes  . Smokeless tobacco: Never Used  . Alcohol use No  . Drug use: No  . Sexual activity: Not on file   Other Topics Concern  . Not on file   Social History  Narrative   Lives locally with her grand-daughter and family.  Does not exercise.     Review of Systems    General:  No chills, fever, night sweats or weight changes.  Cardiovascular:  No chest pain, +++ dyspnea on exertion, +++ chronic LE edema, no orthopnea, palpitations, paroxysmal nocturnal dyspnea. Dermatological: No rash, lesions/masses Respiratory: +++ cough, dyspnea Urologic: No hematuria, dysuria Abdominal:   No nausea, vomiting, diarrhea, bright red blood per rectum, melena, or hematemesis Neurologic:  No visual changes, wkns, changes in mental status. All other systems reviewed and are otherwise negative except as noted above.  Physical Exam    Blood pressure 125/70, pulse (!) 112, temperature 98.2 F (36.8 C), temperature source Oral, resp. rate 18, height 5\' 3"  (1.6 m), weight 118 lb 1.6 oz (53.6 kg), SpO2 95 %.  General: Pleasant, NAD Psych: Normal affect. Neuro: Alert and oriented X 3. Moves all extremities spontaneously. HEENT: Normal  Neck: Supple without bruits or JVD. Lungs:  Resp regular and unlabored, coarse breath sounds throughout with rhonchi and insp/exp wheezing noted. Heart: IR, IR no s3, s4, or murmurs. Abdomen: Soft, non-tender, non-distended, BS + x 4.  Extremities: No clubbing, cyanosis.  Trace to 1+ bilat LE edema with dsgs to bilat lower legs. DP/PT/Radials 1+ and equal bilaterally.  Labs     Recent Labs  03/08/16 1534  TROPONINI 0.03*   Lab Results  Component Value Date   WBC 14.1 (H) 03/09/2016   HGB 11.8 (L) 03/09/2016   HCT 35.7 03/09/2016   MCV 81.7 03/09/2016   PLT 376 03/09/2016    Recent Labs Lab 03/08/16 1534  03/09/16 0631  NA 123*  --  129*  K 3.5  --  3.2*  CL 90*  --  91*  CO2 24  --  28  BUN 11  --  8  CREATININE 0.40*  < > 0.51  CALCIUM 8.8*  --  9.1  PROT 6.4*  --   --   BILITOT 0.7  --   --   ALKPHOS 155*  --   --   ALT 20  --   --   AST 26  --   --   GLUCOSE 107*  --  122*  < > = values in this interval  not displayed.   Radiology Studies    Dg Chest Port 1 View  Result Date: 03/08/2016 CLINICAL DATA:  Hypertension, diabetes and CHF. EXAM: PORTABLE CHEST 1 VIEW COMPARISON:  None. FINDINGS: The heart is enlarged. There is moderate tortuosity and dense calcification of the thoracic aorta. Small bilateral pleural effusions and bibasilar infiltrates or atelectasis. No pulmonary edema or pneumothorax. The bony thorax is intact.  Calcifications are noted around both shoulder joints. IMPRESSION: Cardiac enlargement. Small effusions, left greater than right and bibasilar infiltrates or atelectasis. Electronically Signed   By: Rudie Meyer M.D.   On: 03/08/2016 15:25    ECG & Cardiac Imaging    Afib, 120, lad, old anterolateral infarct.  Assessment & Plan    1.  Acute respiratory failure/Acute diastolic chf:  Pt says that she developed cough on the morning of 3/1 which progressed throughout the day and became assoc with dyspnea.  In ED she was found to have leukocytosis with ? Of pna on cxr.  BNP also moderately elevated.  She has responded well to IV lasix  minus 2.1L since admission with apparent 5 lbs wt loss.  She does not appear to have significant volume overload on exam at this time.  Creat/bicarb bumping slightly - will switch lasix to PO.  She has very coarse breath sounds with rhonchi and wheezing.  Cont Abx/inhalers per IM.    2.  Chronic atrial fibrillation:  Rates elevated in the setting of above.  Cont PO dilt.  Per notes, she was recently taken off of eliquis by her PCP - likely 2/2 recent falls.  Pt was unaware that this was d/c'd.  This was ordered on admission.  I will d/c given prior decision.  3.  Hyponatremia: improved this am.  4.  Hypokalemia:  Supp.  5.  HTN:  Stable.  6.  HL:  On low dose statin.  Signed, Nicolasa Ducking, NP 03/09/2016, 10:47 AM

## 2016-03-09 NOTE — Care Management Important Message (Signed)
Important Message  Patient Details  Name: Terri Barker MRN: [id number] Date of Birth: 12-07-1927   Medicare Important Message Given:  Yes Initial signed IM printed from Epic and given to patient.   Eber HongGreene, Katalaya Beel R, RN 03/09/2016, 11:20 AM

## 2016-03-09 NOTE — Discharge Instructions (Signed)
Heart Failure Clinic appointment on March 21, 2016 at 10:40am with Terri Kindredina Lisha Vitale, FNP. Please call [phone] to reschedule.

## 2016-03-09 NOTE — Care Management (Signed)
contacted by [facility] and informed patient is currently being followed by nursing.  Discussed adding physical therapy

## 2016-03-09 NOTE — Progress Notes (Signed)
Initial HF Clinic appointment scheduled for March 21, 2016 at 10:40am. Thank you.

## 2016-03-09 NOTE — Progress Notes (Signed)
[facility] - [place] at [facility]   PATIENT NAME: Terri Barker    MR#:  [id number]  DATE OF BIRTH:  [birth year]  SUBJECTIVE:  CHIEF COMPLAINT:   Chief Complaint  Patient presents with  . Abnormal Labs   - recently moved from [place], seen by cardiology in office 2 days ago - admitted with pneumonia, afib with rvr and COPD - feels about the same, sore throat, cough and weakness  REVIEW OF SYSTEMS:  Review of Systems  Constitutional: Positive for malaise/fatigue. Negative for chills and fever.  HENT: Positive for congestion and sore throat. Negative for ear discharge, ear pain, hearing loss and nosebleeds.   Eyes: Negative for blurred vision and double vision.  Respiratory: Positive for cough. Negative for shortness of breath and wheezing.   Cardiovascular: Negative for chest pain, palpitations and leg swelling.  Gastrointestinal: Negative for abdominal pain, constipation, diarrhea, nausea and vomiting.  Genitourinary: Negative for dysuria.  Musculoskeletal: Negative for myalgias.  Neurological: Negative for dizziness, speech change, focal weakness, seizures and headaches.  Psychiatric/Behavioral: Negative for depression.    DRUG ALLERGIES:   Allergies  Allergen Reactions  . Codeine   . Feldene [Piroxicam]   . Sporanox [Itraconazole]   . Sulfa Antibiotics     VITALS:  Blood pressure 130/69, pulse 79, temperature 97.8 F (36.6 C), temperature source Oral, resp. rate 18, height 5\' 3"  (1.6 m), weight 53.6 kg (118 lb 1.6 oz), SpO2 97 %.  PHYSICAL EXAMINATION:  Physical Exam  GENERAL:  83 y.o.-year-old patient lying in the bed with no acute distress.  EYES: Pupils equal, round, reactive to light and accommodation. No scleral icterus. Extraocular muscles intact.  HEENT: Head atraumatic, normocephalic. Oropharynx and nasopharynx clear. Voice is hoarse NECK:  Supple, no jugular venous distention. No thyroid enlargement, no tenderness.  LUNGS: rhoncorus   breath sounds bilaterally, no wheezing, rales or crepitation. No use of accessory muscles of respiration.  CARDIOVASCULAR: S1, S2 normal. No rubs, or gallops. 2/6 systolic murmur present ABDOMEN: Soft, nontender, nondistended. Bowel sounds present. No organomegaly or mass.  EXTREMITIES: No pedal edema, cyanosis, or clubbing.  NEUROLOGIC: Cranial nerves II through XII are intact. Muscle strength 5/5 in all extremities. Sensation intact. Gait not checked.  PSYCHIATRIC: The patient is alert and oriented x 3.  SKIN: No obvious rash, lesion, or ulcer.    LABORATORY PANEL:   CBC  Recent Labs Lab 03/09/16 0631  WBC 14.1*  HGB 11.8*  HCT 35.7  PLT 376   ------------------------------------------------------------------------------------------------------------------  Chemistries   Recent Labs Lab 03/08/16 1534  03/09/16 0631  NA 123*  --  129*  K 3.5  --  3.2*  CL 90*  --  91*  CO2 24  --  28  GLUCOSE 107*  --  122*  BUN 11  --  8  CREATININE 0.40*  < > 0.51  CALCIUM 8.8*  --  9.1  AST 26  --   --   ALT 20  --   --   ALKPHOS 155*  --   --   BILITOT 0.7  --   --   < > = values in this interval not displayed. ------------------------------------------------------------------------------------------------------------------  Cardiac Enzymes  Recent Labs Lab 03/08/16 1534  TROPONINI 0.03*   ------------------------------------------------------------------------------------------------------------------  RADIOLOGY:  Dg Chest Port 1 View  Result Date: 03/08/2016 CLINICAL DATA:  Hypertension, diabetes and CHF. EXAM: PORTABLE CHEST 1 VIEW COMPARISON:  None. FINDINGS: The heart is enlarged. There is moderate tortuosity and dense calcification of  the thoracic aorta. Small bilateral pleural effusions and bibasilar infiltrates or atelectasis. No pulmonary edema or pneumothorax. The bony thorax is intact. Calcifications are noted around both shoulder joints. IMPRESSION: Cardiac  enlargement. Small effusions, left greater than right and bibasilar infiltrates or atelectasis. Electronically Signed   By: Rudie MeyerP.  Gallerani M.D.   On: 03/08/2016 15:25    EKG:   Orders placed or performed during the hospital encounter of 03/08/16  . ED EKG 12-Lead  . ED EKG  . ED EKG 12-Lead  . ED EKG  . EKG 12-Lead  . EKG 12-Lead    ASSESSMENT AND PLAN:   83 year old female with past medical history and chronic atrial fibrillation. Eliquis was just recently discontinued due to falls, diastolic CHF, hypertension and diabetes presents to hospital secondary to worsening shortness of breath.  #1 acute on chronic diastolic congestive heart failure exacerbation-echocardiogram is pending. BNP is moderately elevated. -On IV Lasix twice a day, diuresing well. Lasix changed to by mouth today. -Appreciate cardiology consult.  #2 acute bronchitis-continue cough medicines and antibiotics and inhalers. -Continue oxygen support as needed.  #3 chronic atrial fibrillation-with rapid ventricular response. Likely triggered by her respiratory status. -Continue oral Cardizem. Also on oral metoprolol. -Eliquis was recently discontinued as outpatient due to history of falls. -Recommend aspirin at this time.  #4 hyponatremia-secondary to fluid overload. Improving with diuresis with Lasix. Monitor at this time.  #5 hypokalemia-being replaced  #6 depression and anxiety-continue Seroquel and when necessary Ativan  #7 DVT prophylaxis-eliquis will be discontinued as per cardiology recommendations and will start Lovenox.  Physical therapy consult recommended    All the records are reviewed and case discussed with Care Management/Social Workerr. Management plans discussed with the patient, family and they are in agreement.  CODE STATUS: Full Code  TOTAL TIME TAKING CARE OF THIS PATIENT: 38 minutes.   POSSIBLE D/C IN 2 DAYS, DEPENDING ON CLINICAL CONDITION.   Melroy Bougher M.D on 03/09/2016 at  1:16 PM  Between 7am to 6pm - Pager - [phone]  After 6pm go to [email or web address] - Social research officer, governmentpassword EPAS [facility]  [facility]  Office  [phone]  CC: Primary care physician; Marletta LorBarr, Julie, NP

## 2016-03-09 NOTE — Progress Notes (Signed)
Patient's oxygen saturation was 88% on room air this morning upon assessment. Placed patient on 2LNC and oxygen came up to 94%.

## 2016-03-09 NOTE — Progress Notes (Signed)
Chaplain visited patient as he was rounding around the unit. Pt. Terri SpanielSaid that she had a chest pain and as she spoke it was painful even though she appreciated the visit she needed to rest. Chaplain left the patient alone so that she would rest.

## 2016-03-09 NOTE — Progress Notes (Signed)
Initial Nutrition Assessment  DOCUMENTATION CODES:   Not applicable  INTERVENTION:  -Recommend addition of Ensure daily -Pt may benefit from fluid restriction  NUTRITION DIAGNOSIS:   Inadequate oral intake related to acute illness as evidenced by per patient/family report.  GOAL:   Patient will meet greater than or equal to 90% of their needs  MONITOR:   PO intake, Diet advancement, Labs, Weight trends  REASON FOR ASSESSMENT:   Malnutrition Screening Tool    ASSESSMENT:    83 yo female admitted with acute on chronic CHF, acute bronchitis. Pt with hx of DM, HTN, CHF, falls  Pt reports poor appetite for 2 days prior to admission; prior to this eating well, eating 3 meals per day. Pt sometimes supplements diet with Boost/Ensure Recorded po intake 75% at breakfast this AM, 100% for lunch  Pt reports few pound wt loss; in past few days. Noted weight has trended down since admission but net negative 2.7 L per I/O with diuresis  Labs: sodium 129, potassium 3.2 Meds: lasix, ss novolog, KCl  Nutrition-Focused physical exam completed. Findings are no fat depletion, no muscle depletion, and moderate edema.   Diet Order:  Diet heart healthy/carb modified Room service appropriate? Yes; Fluid consistency: Thin  Skin:  Reviewed, no issues  Last BM:  03/09/16  Height:   Ht Readings from Last 1 Encounters:  03/08/16 5\' 3"  (1.6 m)    Weight:   Wt Readings from Last 1 Encounters:  03/09/16 118 lb 1.6 oz (53.6 kg)    Ideal Body Weight:     BMI:  Body mass index is 20.92 kg/m.  Estimated Nutritional Needs:   Kcal:  1400-1650 kcals  Protein:  70-80 g  Fluid:  >/= 1.4 L  EDUCATION NEEDS:   No education needs identified at this time  Romelle StarcherCate Stevenson Windmiller MS, RD, LDN [phone] Pager  [phone] Weekend/On-Call Pager

## 2016-03-10 DIAGNOSIS — J40 Bronchitis, not specified as acute or chronic: Secondary | ICD-10-CM

## 2016-03-10 DIAGNOSIS — I4891 Unspecified atrial fibrillation: Secondary | ICD-10-CM

## 2016-03-10 DIAGNOSIS — J441 Chronic obstructive pulmonary disease with (acute) exacerbation: Secondary | ICD-10-CM

## 2016-03-10 DIAGNOSIS — R0902 Hypoxemia: Secondary | ICD-10-CM

## 2016-03-10 DIAGNOSIS — I5043 Acute on chronic combined systolic (congestive) and diastolic (congestive) heart failure: Secondary | ICD-10-CM

## 2016-03-10 LAB — GLUCOSE, CAPILLARY
Glucose-Capillary: 220 mg/dL — ABNORMAL HIGH (ref 65–99)
Glucose-Capillary: 136 mg/dL — ABNORMAL HIGH (ref 65–99)
Glucose-Capillary: 364 mg/dL — ABNORMAL HIGH (ref 65–99)
Glucose-Capillary: 280 mg/dL — ABNORMAL HIGH (ref 65–99)

## 2016-03-10 LAB — BASIC METABOLIC PANEL
Anion gap: 8 (ref 5–15)
BUN: 12 mg/dL (ref 6–20)
CO2: 30 mmol/L (ref 22–32)
CREATININE: 0.36 mg/dL — AB (ref 0.44–1.00)
Calcium: 8.5 mg/dL — ABNORMAL LOW (ref 8.9–10.3)
Chloride: 90 mmol/L — ABNORMAL LOW (ref 101–111)
GFR calc Af Amer: >60 mL/min (ref >60)
GLUCOSE: 117 mg/dL — AB (ref 65–99)
POTASSIUM: 3.3 mmol/L — AB (ref 3.5–5.1)
SODIUM: 128 mmol/L — AB (ref 135–145)

## 2016-03-10 LAB — URINE CULTURE: Culture: NO GROWTH

## 2016-03-10 LAB — MAGNESIUM: MAGNESIUM: 1.4 mg/dL — AB (ref 1.7–2.4)

## 2016-03-10 MED ORDER — POTASSIUM CHLORIDE CRYS ER 20 MEQ PO TBCR
40.0000 meq | EXTENDED_RELEASE_TABLET | ORAL | Status: AC
  Administered 2016-03-10 (×2): 40 meq via ORAL
  Filled 2016-03-10 (×2): qty 2

## 2016-03-10 MED ORDER — MAGNESIUM SULFATE 4 GM/100ML IV SOLN
4.0000 g | Freq: Once | INTRAVENOUS | Status: AC
  Administered 2016-03-10: 4 g via INTRAVENOUS
  Filled 2016-03-10: qty 100

## 2016-03-10 MED ORDER — AZITHROMYCIN 250 MG PO TABS
500.0000 mg | ORAL_TABLET | Freq: Every day | ORAL | Status: AC
  Administered 2016-03-10: 500 mg via ORAL
  Filled 2016-03-10: qty 2

## 2016-03-10 MED ORDER — ALBUTEROL SULFATE (2.5 MG/3ML) 0.083% IN NEBU
2.5000 mg | INHALATION_SOLUTION | RESPIRATORY_TRACT | Status: DC
  Administered 2016-03-10 – 2016-03-12 (×12): 2.5 mg via RESPIRATORY_TRACT
  Filled 2016-03-10 (×12): qty 3

## 2016-03-10 MED ORDER — METHYLPREDNISOLONE SODIUM SUCC 125 MG IJ SOLR
60.0000 mg | INTRAMUSCULAR | Status: DC
  Administered 2016-03-10 – 2016-03-12 (×3): 60 mg via INTRAVENOUS
  Filled 2016-03-10 (×3): qty 2

## 2016-03-10 MED ORDER — AZITHROMYCIN 250 MG PO TABS
250.0000 mg | ORAL_TABLET | Freq: Every day | ORAL | Status: DC
  Administered 2016-03-11 – 2016-03-12 (×2): 250 mg via ORAL
  Filled 2016-03-10 (×2): qty 1

## 2016-03-10 MED ORDER — MAGNESIUM OXIDE 400 (241.3 MG) MG PO TABS
400.0000 mg | ORAL_TABLET | Freq: Every day | ORAL | Status: AC
  Administered 2016-03-10 – 2016-03-11 (×2): 400 mg via ORAL
  Filled 2016-03-10 (×2): qty 1

## 2016-03-10 NOTE — Progress Notes (Signed)
Patient: Terri Barker / Admit Date: 03/08/2016 / Date of Encounter: 03/10/2016, 5:24 PM   Hospital Problem List     Acute respiratory failure Chronic atrial fibrillation COPD, acute exacerbation with bronchitis  Subjective   Seen by Dr. Allyson Sabal February 28 Nonhealing wounds right shin and calf Notes indicate history of diastolic heart failure, recently moved from [place] secondary to recurrent falls in the assisted care facility Notes indicate chronic atrial fibrillation and has been on oral anticoagulation Lasix recently increased for her extremity edema Metoprolol and anticoagulation recently held by primary care  Still with significant cough, sputum Currently on broad-spectrum antibiotics     Inpatient Medications    Scheduled Meds: . acidophilus  1 capsule Oral Daily  . albuterol  2.5 mg Nebulization Q4H  . aspirin  81 mg Oral Daily  . atorvastatin  5 mg Oral QHS  . [START ON 03/11/2016] azithromycin  250 mg Oral Daily  . benzonatate  100 mg Oral TID  . busPIRone  7.5 mg Oral BID  . calcium-vitamin D  1 tablet Oral Daily  . diltiazem  360 mg Oral Daily  . enoxaparin (LOVENOX) injection  40 mg Subcutaneous Q24H  . feeding supplement (ENSURE ENLIVE)  237 mL Oral Q24H  . furosemide  40 mg Oral Daily  . guaiFENesin  600 mg Oral BID  . insulin aspart  0-5 Units Subcutaneous QHS  . insulin aspart  0-9 Units Subcutaneous TID WC  . magnesium oxide  400 mg Oral Daily  . methylPREDNISolone (SOLU-MEDROL) injection  60 mg Intravenous Q24H  . metoprolol tartrate  25 mg Oral BID  . oxybutynin  10 mg Oral q morning - 10a  . potassium chloride SA  20 mEq Oral BID  . QUEtiapine  25 mg Oral QHS  . sodium chloride flush  3 mL Intravenous Q12H   Continuous Infusions:  PRN Meds: sodium chloride, acetaminophen **OR** acetaminophen, LORazepam, ondansetron **OR** ondansetron (ZOFRAN) IV, phenol-menthol, sodium chloride flush, traMADol   Objective: Telemetry:  Physical Exam: Blood  pressure (!) 109/54, pulse 63, temperature 97.5 F (36.4 C), temperature source Oral, resp. rate 12, height 5\' 3"  (1.6 m), weight 116 lb 9.6 oz (52.9 kg), SpO2 93 %. Body mass index is 20.65 kg/m. GEN: Mild respiratory distress, thin, Coughing HEENT: Grossly normal.  Neck: Supple, no JVD, carotid bruits, or masses. Cardiac: RRR, no murmurs, rubs, or gallops. No clubbing, cyanosis, edema.  Radials/DP/PT 2+ and equal bilaterally.  Respiratory:  Very coarse breath sounds bilaterally GI: Soft, nontender, nondistended, BS + x 4. MS: no deformity or atrophy. Skin: warm and dry, no rash. Neuro:  Strength and sensation are intact. Psych: AAOx3.  Normal affect.   Intake/Output Summary (Last 24 hours) at 03/10/16 1724 Last data filed at 03/10/16 1600  Gross per 24 hour  Intake              660 ml  Output             2451 ml  Net            -1791 ml     Labs: CBC  Recent Labs  03/08/16 1534 03/09/16 0631  WBC 14.1* 14.1*  NEUTROABS 10.6*  --   HGB 11.7* 11.8*  HCT 33.9* 35.7  MCV 81.0 81.7  PLT 369 376   Basic Metabolic Panel  Recent Labs  03/09/16 0631 03/10/16 0538  NA 129* 128*  K 3.2* 3.3*  CL 91* 90*  CO2 28 30  GLUCOSE  122* 117*  BUN 8 12  CREATININE 0.51 0.36*  CALCIUM 9.1 8.5*  MG  --  1.4*   Liver Function Tests  Recent Labs  03/08/16 1534  AST 26  ALT 20  ALKPHOS 155*  BILITOT 0.7  PROT 6.4*  ALBUMIN 3.1*    Recent Labs  03/08/16 1534  LIPASE 40   Cardiac Enzymes  Recent Labs  03/08/16 1534  TROPONINI 0.03*   BNP Invalid input(s): POCBNP D-Dimer No results for input(s): DDIMER in the last 72 hours. Hemoglobin A1C  Recent Labs  03/08/16 2134  HGBA1C 6.1*   Fasting Lipid Panel No results for input(s): CHOL, HDL, LDLCALC, TRIG, CHOLHDL, LDLDIRECT in the last 72 hours. Thyroid Function Tests No results for input(s): TSH, T4TOTAL, T3FREE, THYROIDAB in the last 72 hours.  Invalid input(s): FREET3  Weights: Filed Weights    03/08/16 1927 03/09/16 0258 03/10/16 0410  Weight: 123 lb 1.6 oz (55.8 kg) 118 lb 1.6 oz (53.6 kg) 116 lb 9.6 oz (52.9 kg)     Radiology/Studies:  Dg Chest Port 1 View  Result Date: 03/08/2016 CLINICAL DATA:  Hypertension, diabetes and CHF. EXAM: PORTABLE CHEST 1 VIEW COMPARISON:  None. FINDINGS: The heart is enlarged. There is moderate tortuosity and dense calcification of the thoracic aorta. Small bilateral pleural effusions and bibasilar infiltrates or atelectasis. No pulmonary edema or pneumothorax. The bony thorax is intact. Calcifications are noted around both shoulder joints. IMPRESSION: Cardiac enlargement. Small effusions, left greater than right and bibasilar infiltrates or atelectasis. Electronically Signed   By: Rudie MeyerP.  Gallerani M.D.   On: 03/08/2016 15:25     Assessment and Plan  83 y.o. female  #1 acute on chronic . Respiratory failure with hypoxia due to COPD exacerbation Unable to exclude component of acute on chronic diastolic CHF Clinical exam more consistent with bronchitis and COPD She is on broad-spectrum antibiotics, nebulizers  #2 . COPD exacerbation due toacute bronchitis  antibiotics and inhalers, steroids intravenously, continue oxygen support as needed.  #3 chronic atrial fibrillation-with rapid ventricular response.  Likely triggered by her respiratory status. -Continue oral Cardizem. Also on oral metoprolol for now -Eliquis was recently discontinued as outpatient due to history of falls. If she is going to rehabilitation, potentially could restart eliquis  #4 depression and anxiety-continue Seroquel and when necessary Ativan  #5 DVT prophylaxis-  on  Lovenox.   Total encounter time more than 25 minutes  Greater than 50% was spent in counseling and coordination of care with the patient   Signed, Dossie Arbourim Georgie Eduardo, MD, Ph.D. [facility] 03/10/2016, 5:24 PM

## 2016-03-10 NOTE — Progress Notes (Signed)
Pt requesting to skip 12am and 4am treatments so she can sleep. Rt informed pt of the importance of taking scheduled treatments. Pt responded that she was going to take her night time meds and sleep without being woke up tonight.

## 2016-03-10 NOTE — Progress Notes (Signed)
[facility] - [place] at [facility]   PATIENT NAME: Terri Barker    MR#:  [id number]  DATE OF BIRTH:  April 30, 1927  SUBJECTIVE:  CHIEF COMPLAINT:   Chief Complaint  Patient presents with  . Abnormal Labs   - recently moved from [place], seen by cardiology in office 2 days ago - admitted with pneumonia, afib with rvr and COPD - feels about the same, sore throat, cough and weakness.Patient was initiated on antibiotic therapy recently, feels somewhat better, still complains of cough, no significant sputum production, wheezing. She feels short of breath   REVIEW OF SYSTEMS:  Review of Systems  Constitutional: Positive for malaise/fatigue. Negative for chills and fever.  HENT: Positive for congestion and sore throat. Negative for ear discharge, ear pain, hearing loss and nosebleeds.   Eyes: Negative for blurred vision and double vision.  Respiratory: Positive for cough, shortness of breath and wheezing.   Cardiovascular: Negative for chest pain, palpitations and leg swelling.  Gastrointestinal: Negative for abdominal pain, constipation, diarrhea, nausea and vomiting.  Genitourinary: Negative for dysuria.  Musculoskeletal: Negative for myalgias.  Neurological: Negative for dizziness, speech change, focal weakness, seizures and headaches.  Psychiatric/Behavioral: Negative for depression.    DRUG ALLERGIES:   Allergies  Allergen Reactions  . Codeine   . Feldene [Piroxicam]   . Sporanox [Itraconazole]   . Sulfa Antibiotics     VITALS:  Blood pressure (!) 109/54, pulse 63, temperature 97.5 F (36.4 C), temperature source Oral, resp. rate 12, height 5\' 3"  (1.6 m), weight 52.9 kg (116 lb 9.6 oz), SpO2 93 %.  PHYSICAL EXAMINATION:  Physical Exam  GENERAL:  83 y.o.-year-old patient lying in the bedIn mild respiratory  distress, Coughing intermittently, resting in the chest, wheezing.  EYES: Pupils equal, round, reactive to light and accommodation. No scleral  icterus. Extraocular muscles intact.  HEENT: Head atraumatic, normocephalic. Oropharynx and nasopharynx clear. Voice is hoarse NECK:  Supple, no jugular venous distention. No thyroid enlargement, no tenderness.  LUNGS: rhoncorus  breath sounds bilaterally,expiratory wheezing, scattered rhonchi,  rales, but no crepitations.intermittent use of accessory muscles of respiration.  CARDIOVASCULAR: S1, S2 normal. No rubs, or gallops. 2/6 systolic murmur present ABDOMEN: Soft, nontender, nondistended. Bowel sounds present. No organomegaly or mass.  EXTREMITIES: No pedal edema, cyanosis, or clubbing.  NEUROLOGIC: Cranial nerves II through XII are intact. Muscle strength 5/5 in all extremities. Sensation intact. Gait not checked.  PSYCHIATRIC: The patient is alert and oriented x 3.  SKIN: No obvious rash, lesion, or ulcer.    LABORATORY PANEL:   CBC  Recent Labs Lab 03/09/16 0631  WBC 14.1*  HGB 11.8*  HCT 35.7  PLT 376   ------------------------------------------------------------------------------------------------------------------  Chemistries   Recent Labs Lab 03/08/16 1534  03/10/16 0538  NA 123*  < > 128*  K 3.5  < > 3.3*  CL 90*  < > 90*  CO2 24  < > 30  GLUCOSE 107*  < > 117*  BUN 11  < > 12  CREATININE 0.40*  < > 0.36*  CALCIUM 8.8*  < > 8.5*  MG  --   --  1.4*  AST 26  --   --   ALT 20  --   --   ALKPHOS 155*  --   --   BILITOT 0.7  --   --   < > = values in this interval not displayed. ------------------------------------------------------------------------------------------------------------------  Cardiac Enzymes  Recent Labs Lab 03/08/16 1534  TROPONINI 0.03*   ------------------------------------------------------------------------------------------------------------------  RADIOLOGY:  Dg Chest Port 1 View  Result Date: 03/08/2016 CLINICAL DATA:  Hypertension, diabetes and CHF. EXAM: PORTABLE CHEST 1 VIEW COMPARISON:  None. FINDINGS: The heart is  enlarged. There is moderate tortuosity and dense calcification of the thoracic aorta. Small bilateral pleural effusions and bibasilar infiltrates or atelectasis. No pulmonary edema or pneumothorax. The bony thorax is intact. Calcifications are noted around both shoulder joints. IMPRESSION: Cardiac enlargement. Small effusions, left greater than right and bibasilar infiltrates or atelectasis. Electronically Signed   By: Rudie MeyerP.  Gallerani M.D.   On: 03/08/2016 15:25    EKG:   Orders placed or performed during the hospital encounter of 03/08/16  . ED EKG 12-Lead  . ED EKG  . ED EKG 12-Lead  . ED EKG  . EKG 12-Lead  . EKG 12-Lead    ASSESSMENT AND PLAN:   83 year old female with past medical history and chronic atrial fibrillation. Eliquis was just recently discontinued due to falls, diastolic CHF, hypertension and diabetes presents to hospital secondary to worsening shortness of breath.  #1 acute on chronic . Respiratory failure with hypoxia due to COPD exacerbation and diastolic congestive heart failure exacerbation-echocardiogram revealed moderate LVH, moderate MR, calcified mitral apparatus, right ventricular dysfunction. Left pleural effusion.Continue Lasix by mouth, the patient diuresed about 3.5 L during past 24 hours . -Appreciate cardiology consult.  #2 . COPD exacerbation due toacute bronchitis-continue cough medicines and antibiotics and inhalers. Adding steroids intravenously, follow clinically, continue oxygen support as needed.  #3 chronic atrial fibrillation-with rapid ventricular response. Likely triggered by her respiratory status. -Continue oral Cardizem. Also on oral metoprolol. -Eliquis was recently discontinued as outpatient due to history of falls. -Recommend aspirin at this time.  #4 hyponatremia-secondary to fluid overload. Improved with diuresis, however, worse today, follow in the morning   #5 hypokalemia-being replaced, magnesium level was found to be low,  supplementing intravenously, pressure, potassium as well as magnesium levels tomorrow    #6 depression and anxiety-continue Seroquel and when necessary Ativan  #7 DVT prophylaxis-eliquis was  discontinued per cardiology recommendations and  Started on  Lovenox.  Physical therapy consult recommended    All the records are reviewed and case discussed with Care Management/Social Workerr. Management plans discussed with the patient, family and they are in agreement.  CODE STATUS: Full Code  TOTAL TIME TAKING CARE OF THIS PATIENT: 40 minutes.   POSSIBLE D/C IN 2 DAYS, DEPENDING ON CLINICAL CONDITION.   Katharina CaperVAICKUTE,Jaythen Hamme M.D on 03/10/2016 at 12:38 PM  Between 7am to 6pm - Pager - [phone]  After 6pm go to [email or web address] - Social research officer, governmentpassword EPAS [facility]  [facility]  Office  [phone]  CC: Primary care physician; Marletta LorBarr, Julie, NP

## 2016-03-11 LAB — BASIC METABOLIC PANEL
ANION GAP: 9 (ref 5–15)
BUN: 12 mg/dL (ref 6–20)
CALCIUM: 8.9 mg/dL (ref 8.9–10.3)
CO2: 28 mmol/L (ref 22–32)
CREATININE: 0.44 mg/dL (ref 0.44–1.00)
Chloride: 93 mmol/L — ABNORMAL LOW (ref 101–111)
GLUCOSE: 181 mg/dL — AB (ref 65–99)
Potassium: 4 mmol/L (ref 3.5–5.1)
Sodium: 130 mmol/L — ABNORMAL LOW (ref 135–145)

## 2016-03-11 LAB — CBC
HCT: 35.3 % (ref 35.0–47.0)
HEMOGLOBIN: 11.5 g/dL — AB (ref 12.0–16.0)
MCH: 26.5 pg (ref 26.0–34.0)
MCHC: 32.7 g/dL (ref 32.0–36.0)
MCV: 81 fL (ref 80.0–100.0)
Platelets: 396 10*3/uL (ref 150–440)
RBC: 4.36 MIL/uL (ref 3.80–5.20)
RDW: 15.7 % — ABNORMAL HIGH (ref 11.5–14.5)
WBC: 15.4 10*3/uL — ABNORMAL HIGH (ref 3.6–11.0)

## 2016-03-11 LAB — GLUCOSE, CAPILLARY
GLUCOSE-CAPILLARY: 242 mg/dL — AB (ref 65–99)
GLUCOSE-CAPILLARY: 255 mg/dL — AB (ref 65–99)
GLUCOSE-CAPILLARY: 380 mg/dL — AB (ref 65–99)
Glucose-Capillary: 422 mg/dL — ABNORMAL HIGH (ref 65–99)
Glucose-Capillary: 339 mg/dL — ABNORMAL HIGH (ref 65–99)

## 2016-03-11 LAB — GLUCOSE, RANDOM: Glucose, Bld: 489 mg/dL — ABNORMAL HIGH (ref 65–99)

## 2016-03-11 MED ORDER — DOXYCYCLINE HYCLATE 100 MG PO TABS
100.0000 mg | ORAL_TABLET | Freq: Two times a day (BID) | ORAL | Status: DC
  Administered 2016-03-11 – 2016-03-12 (×2): 100 mg via ORAL
  Filled 2016-03-11 (×3): qty 1

## 2016-03-11 MED ORDER — LORAZEPAM 0.5 MG PO TABS
0.5000 mg | ORAL_TABLET | Freq: Four times a day (QID) | ORAL | Status: DC | PRN
Start: 2016-03-11 — End: 2016-03-12
  Administered 2016-03-11 (×2): 0.5 mg via ORAL
  Filled 2016-03-11: qty 1

## 2016-03-11 NOTE — Progress Notes (Signed)
Leg dressing changed.  Left leg anterior laterial wound with purulent drainage with bad odor.  Left leg post medial wound with serosanguinous/ purulent drainage, only small amount.  Culture sent from each wound.  Wounds cleansed with gauze and NS.  Moist dressing followed by kerlix wrap on each wound.  Dr. Zorita PangVaikute notified. Ordered po Doxycycline.

## 2016-03-11 NOTE — Progress Notes (Signed)
[employer] [facility] [place] [facility]   PATIENT NAME: Terri Barker    MR#:  [id number]  DATE OF BIRTH:  Jul 14, 1927  SUBJECTIVE:  CHIEF COMPLAINT:   Chief Complaint  Patient presents with  . Abnormal Labs   - recently moved from [place], seen by cardiology in office 2 days ago - admitted with pneumonia, afib with rvr and COPD - feels about the same, sore throat, cough and weakness.Patient was initiated on antibiotic therapy recently, feels somewhat better, still complains of cough, no significant sputum production, wheezing. She feels short of breath , however, better than yesterday  REVIEW OF SYSTEMS:  Review of Systems  Constitutional: Positive for malaise/fatigue. Negative for chills and fever.  HENT: Positive for congestion and sore throat. Negative for ear discharge, ear pain, hearing loss and nosebleeds.   Eyes: Negative for blurred vision and double vision.  Respiratory: Positive for cough, shortness of breath and wheezing.   Cardiovascular: Negative for chest pain, palpitations and leg swelling.  Gastrointestinal: Negative for abdominal pain, constipation, diarrhea, nausea and vomiting.  Genitourinary: Negative for dysuria.  Musculoskeletal: Negative for myalgias.  Neurological: Negative for dizziness, speech change, focal weakness, seizures and headaches.  Psychiatric/Behavioral: Negative for depression.    DRUG ALLERGIES:   Allergies  Allergen Reactions  . Codeine   . Feldene [Piroxicam]   . Sporanox [Itraconazole]   . Sulfa Antibiotics     VITALS:  Blood pressure (!) 122/54, pulse 70, temperature 97.7 F (36.5 C), temperature source Oral, resp. rate 14, height 5\' 3"  (1.6 m), weight 53.2 kg (117 lb 3.2 oz), SpO2 99 %.  PHYSICAL EXAMINATION:  Physical Exam  GENERAL:  83 y.o.-year-old patient lying in the bedIn mild respiratory  distress, Coughing intermittently, resting in the chest, wheezing.  EYES: Pupils equal, round, reactive to light and  accommodation. No scleral icterus. Extraocular muscles intact.  HEENT: Head atraumatic, normocephalic. Oropharynx and nasopharynx clear. Voice is hoarse NECK:  Supple, no jugular venous distention. No thyroid enlargement, no tenderness.  LUNGS: rhoncorus  breath sounds bilaterally,expiratory wheezing, scattered rhonchi,  rales, but no crepitations.intermittent use of accessory muscles of respiration.  CARDIOVASCULAR: S1, S2 normal. No rubs, or gallops. 2/6 systolic murmur present ABDOMEN: Soft, nontender, nondistended. Bowel sounds present. No organomegaly or mass.  EXTREMITIES: No pedal edema, cyanosis, or clubbing.  NEUROLOGIC: Cranial nerves II through XII are intact. Muscle strength 5/5 in all extremities. Sensation intact. Gait not checked.  PSYCHIATRIC: The patient is alert and oriented x 3.  SKIN: No obvious rash, lesion, or ulcer.    LABORATORY PANEL:   CBC  Recent Labs Lab 03/11/16 0457  WBC 15.4*  HGB 11.5*  HCT 35.3  PLT 396   ------------------------------------------------------------------------------------------------------------------  Chemistries   Recent Labs Lab 03/08/16 1534  03/10/16 0538 03/11/16 0457  NA 123*  < > 128* 130*  K 3.5  < > 3.3* 4.0  CL 90*  < > 90* 93*  CO2 24  < > 30 28  GLUCOSE 107*  < > 117* 181*  BUN 11  < > 12 12  CREATININE 0.40*  < > 0.36* 0.44  CALCIUM 8.8*  < > 8.5* 8.9  MG  --   --  1.4*  --   AST 26  --   --   --   ALT 20  --   --   --   ALKPHOS 155*  --   --   --   BILITOT 0.7  --   --   --   < > =  values in this interval not displayed. ------------------------------------------------------------------------------------------------------------------  Cardiac Enzymes  Recent Labs Lab 03/08/16 1534  TROPONINI 0.03*   ------------------------------------------------------------------------------------------------------------------  RADIOLOGY:  No results found.  EKG:   Orders placed or performed during the  hospital encounter of 03/08/16  . ED EKG 12-Lead  . ED EKG  . ED EKG 12-Lead  . ED EKG  . EKG 12-Lead  . EKG 12-Lead    ASSESSMENT AND PLAN:   83 year old female with past medical history and chronic atrial fibrillation. Eliquis was just recently discontinued due to falls, diastolic CHF, hypertension and diabetes presents to hospital secondary to worsening shortness of breath.  #1 acute on chronic . Respiratory failure with hypoxia due to COPD exacerbation and diastolic congestive heart failure exacerbation-echocardiogram revealed moderate LVH, moderate MR, calcified mitral apparatus, right ventricular dysfunction. Left pleural effusion.Continue Lasix by mouth, the patient diuresed about 6.7 L since admission . -Appreciate cardiology consult.  #2 . COPD exacerbation due toacute bronchitis-continue cough medicines and antibiotics and inhalers. , steroids intravenously, and proved clinically, continue oxygen support as needed, weaned down to 0.5 L now.  #3 chronic atrial fibrillation-with rapid ventricular response. Likely triggered by her respiratory status. -Continue oral Cardizem. Also on oral metoprolol. -Eliquis was recently discontinued as outpatient due to history of falls. -Recommend aspirin at this time.  #4 hyponatremia-secondary to fluid overload. Improved with diuresis   #5 hypokalemia-replaced, magnesium level was found to be low, supplemented intravenously   #6 depression and anxiety-continue Seroquel and when necessary Ativan  #7 DVT prophylaxis-eliquis was  discontinued per cardiology recommendations and  Started on  Lovenox.  8. Generalized weakness, Physical therapy consult . Recommendations are pending    All the records are reviewed and case discussed with Care Management/Social Workerr. Management plans discussed with the patient, family and they are in agreement.  CODE STATUS: Full Code  TOTAL TIME TAKING CARE OF THIS PATIENT: 35 minutes.   POSSIBLE D/C  IN 2 DAYS, DEPENDING ON CLINICAL CONDITION.   Katharina CaperVAICKUTE,Louna Rothgeb M.D on 03/11/2016 at 1:46 PM  Between 7am to 6pm - Pager - [phone]  After 6pm go to [email or web address] - Social research officer, governmentpassword EPAS [facility]  [facility] [place] [facility]  Office  [phone]  CC: Primary care physician; Marletta LorBarr, Julie, NP

## 2016-03-11 NOTE — Plan of Care (Signed)
Problem: Pain Managment: Goal: General experience of comfort will improve Outcome: Progressing Complained of both a headache and left leg pain today.  She only takes tylenol and ativan at home.  These were given.  May not be enough for the leg pain.  monitor

## 2016-03-11 NOTE — Progress Notes (Signed)
C/o feeling anxious.  Doesn't remember she takes ativan 0.5 mg at home.  Caregiver says she takes it home.  Granddaughter confirms.

## 2016-03-12 ENCOUNTER — Inpatient Hospital Stay: Payer: Medicare Other

## 2016-03-12 ENCOUNTER — Encounter: Payer: Self-pay | Admitting: Radiology

## 2016-03-12 DIAGNOSIS — I4891 Unspecified atrial fibrillation: Secondary | ICD-10-CM

## 2016-03-12 DIAGNOSIS — J9621 Acute and chronic respiratory failure with hypoxia: Secondary | ICD-10-CM

## 2016-03-12 DIAGNOSIS — E871 Hypo-osmolality and hyponatremia: Secondary | ICD-10-CM

## 2016-03-12 DIAGNOSIS — R531 Weakness: Secondary | ICD-10-CM

## 2016-03-12 DIAGNOSIS — E876 Hypokalemia: Secondary | ICD-10-CM

## 2016-03-12 LAB — GLUCOSE, CAPILLARY
Glucose-Capillary: 194 mg/dL — ABNORMAL HIGH (ref 65–99)
Glucose-Capillary: 195 mg/dL — ABNORMAL HIGH (ref 65–99)
Glucose-Capillary: 394 mg/dL — ABNORMAL HIGH (ref 65–99)

## 2016-03-12 LAB — BASIC METABOLIC PANEL
ANION GAP: 7 (ref 5–15)
BUN: 14 mg/dL (ref 6–20)
CALCIUM: 9 mg/dL (ref 8.9–10.3)
CHLORIDE: 93 mmol/L — AB (ref 101–111)
CO2: 27 mmol/L (ref 22–32)
Creatinine, Ser: 0.46 mg/dL (ref 0.44–1.00)
GFR calc non Af Amer: >60 mL/min (ref >60)
GLUCOSE: 210 mg/dL — AB (ref 65–99)
POTASSIUM: 4.5 mmol/L (ref 3.5–5.1)
Sodium: 127 mmol/L — ABNORMAL LOW (ref 135–145)

## 2016-03-12 LAB — MRSA PCR SCREENING: MRSA by PCR: NEGATIVE

## 2016-03-12 MED ORDER — APIXABAN 2.5 MG PO TABS: 2.5000 mg | ORAL_TABLET | Freq: Two times a day (BID) | ORAL | 6 refills | Status: AC

## 2016-03-12 MED ORDER — ENSURE ENLIVE PO LIQD: 237.0000 mL | ORAL | 12 refills | Status: AC

## 2016-03-12 MED ORDER — MUPIROCIN CALCIUM 2 % EX CREA
TOPICAL_CREAM | Freq: Every day | CUTANEOUS | Status: DC
  Administered 2016-03-12: 13:00:00 via TOPICAL
  Filled 2016-03-12: qty 15

## 2016-03-12 MED ORDER — FUROSEMIDE 20 MG PO TABS: 20.0000 mg | ORAL_TABLET | Freq: Every day | ORAL | Status: DC

## 2016-03-12 MED ORDER — ALBUTEROL SULFATE (2.5 MG/3ML) 0.083% IN NEBU: 2.5000 mg | INHALATION_SOLUTION | RESPIRATORY_TRACT | 12 refills | Status: DC

## 2016-03-12 MED ORDER — PREDNISONE 10 MG PO TABS: 10.0000 mg | ORAL_TABLET | Freq: Every day | ORAL | 0 refills | Status: AC

## 2016-03-12 MED ORDER — IOPAMIDOL (ISOVUE-370) INJECTION 76%
75.0000 mL | Freq: Once | INTRAVENOUS | Status: AC | PRN
  Administered 2016-03-12: 75 mL via INTRAVENOUS

## 2016-03-12 MED ORDER — GUAIFENESIN ER 600 MG PO TB12: 600.0000 mg | ORAL_TABLET | Freq: Two times a day (BID) | ORAL | 0 refills | Status: AC

## 2016-03-12 MED ORDER — APIXABAN 2.5 MG PO TABS
2.5000 mg | ORAL_TABLET | Freq: Two times a day (BID) | ORAL | Status: DC
  Administered 2016-03-12: 2.5 mg via ORAL
  Filled 2016-03-12: qty 1

## 2016-03-12 MED ORDER — CEPASTAT 14.5 MG MT LOZG: 1.0000 | LOZENGE | OROMUCOSAL | 0 refills | Status: AC | PRN

## 2016-03-12 MED ORDER — MUPIROCIN CALCIUM 2 % EX CREA: TOPICAL_CREAM | Freq: Every day | CUTANEOUS | 0 refills | Status: AC

## 2016-03-12 MED ORDER — METOPROLOL TARTRATE 25 MG PO TABS: 25.0000 mg | ORAL_TABLET | Freq: Two times a day (BID) | ORAL | 6 refills | Status: AC

## 2016-03-12 MED ORDER — ALBUTEROL SULFATE (2.5 MG/3ML) 0.083% IN NEBU: 2.5000 mg | INHALATION_SOLUTION | RESPIRATORY_TRACT | 0 refills | Status: DC

## 2016-03-12 MED ORDER — DOXYCYCLINE HYCLATE 100 MG PO TABS: 100.0000 mg | ORAL_TABLET | Freq: Two times a day (BID) | ORAL | 0 refills | Status: AC

## 2016-03-12 MED ORDER — ALBUTEROL SULFATE (2.5 MG/3ML) 0.083% IN NEBU: 2.5000 mg | INHALATION_SOLUTION | RESPIRATORY_TRACT | 0 refills | Status: AC

## 2016-03-12 MED ORDER — BENZONATATE 100 MG PO CAPS: 100.0000 mg | ORAL_CAPSULE | Freq: Three times a day (TID) | ORAL | 0 refills | Status: AC

## 2016-03-12 NOTE — Progress Notes (Signed)
Patient walked one circuit of the unit on room air.  O2 sats ranged from 91 - 95%.  She was 92% prior to the walk. No home oxygen is needed.

## 2016-03-12 NOTE — Progress Notes (Signed)
Inpatient Diabetes Program Recommendations  AACE/ADA: New Consensus Statement on Inpatient Glycemic Control (2015)  Target Ranges:  Prepandial:   less than 140 mg/dL      Peak postprandial:   less than 180 mg/dL (1-2 hours)      Critically ill patients:  140 - 180 mg/dL   Lab Results  Component Value Date   GLUCAP 194 (H) 03/12/2016   HGBA1C 6.1 (H) 03/08/2016    Review of Glycemic Control:  Results for Terri Barker, Terri (MRN [id number]) as of 03/12/2016 09:53  Ref. Range 03/11/2016 07:35 03/11/2016 11:52 03/11/2016 16:43 03/11/2016 19:09 03/11/2016 20:40 03/12/2016 07:37  Glucose-Capillary Latest Ref Range: 65 - 99 mg/dL 952242 (H) 841255 (H) 324422 (H) 380 (H) 339 (H) 194 (H)   Diabetes history: Type 2 diabetes Outpatient Diabetes medications: Amaryl 1 mg bid Current orders for Inpatient glycemic control:  Novolog sensitive tid with meals and HS, Solumedrol 60 mg every 24 hours  Inpatient Diabetes Program Recommendations:    While on steroids, consider adding Novolog meal coverage 3 units tid with meals (hold if patient eats less than 50%).  Also consider increasing Novolog correction to moderate tid with meals with HS scale.   Thanks, Beryl MeagerJenny Idonna Heeren, RN, BC-ADM Inpatient Diabetes Coordinator Pager [phone] (8a-5p)

## 2016-03-12 NOTE — Evaluation (Signed)
Physical Therapy Evaluation Patient Details Name: Terri Barker MRN: [id number] DOB: 03/28/1927 Today's Date: 03/12/2016   History of Present Illness  Pt admitted for acute on chronic systolic and diastolic CHF. Pt with complaints of coughing with SOB symptoms and leg swelling. Pt with history of Afib, CHF, DM, and HTN.   Clinical Impression  Pt is a pleasant 83 year old female who was admitted for acute on chronic systolic and diastolic CHF. Pt has wounds on B LEs with R leg wrapped. Pt performs bed mobility with mod I, transfers with mod I, and ambulation with cga and RW. Pt refused further ambulation as pt very hungry and meal tray arrived. Pt demonstrates deficits with strength/endurance/mobility. Per grand daughter, pt ambulates minimal distance in home environment using rollater. Would benefit from skilled PT to address above deficits and promote optimal return to PLOF. Recommend transition to HHPT upon discharge from acute hospitalization.       Follow Up Recommendations Home health PT    Equipment Recommendations  None recommended by PT    Recommendations for Other Services       Precautions / Restrictions Precautions Precautions: Fall Restrictions Weight Bearing Restrictions: No      Mobility  Bed Mobility Overal bed mobility: Modified Independent             General bed mobility comments: safe technique with ability to use bed rails and sit at EOB.  Transfers Overall transfer level: Modified independent Equipment used: Rolling walker (2 wheeled)             General transfer comment: safe technique with upright standing using RW. No complaints of pain  Ambulation/Gait Ambulation/Gait assistance: Min guard Ambulation Distance (Feet): 5 Feet Assistive device: Rolling walker (2 wheeled) Gait Pattern/deviations: Step-to pattern     General Gait Details: Pt ambulated to recliner. Safe technique performed with no LOB noted. Good balance with turning and  backing up towards chair  Stairs            Wheelchair Mobility    Modified Rankin (Stroke Patients Only)       Balance Overall balance assessment: History of Falls;Needs assistance Sitting-balance support: Feet supported Sitting balance-Leahy Scale: Good     Standing balance support: Bilateral upper extremity supported Standing balance-Leahy Scale: Good                               Pertinent Vitals/Pain Pain Assessment: No/denies pain    Home Living Family/patient expects to be discharged to:: Private residence Living Arrangements:  (grand duaghter and family) Available Help at Discharge: Family Type of Home: House Home Access: Level entry     Home Layout: One level Home Equipment: Environmental consultant - 4 wheels;Bedside commode;Grab bars - tub/shower      Prior Function Level of Independence: Independent with assistive device(s)         Comments: uses rollater for all mobility, per grandaughter, has not had any falls since moving in with her     Hand Dominance        Extremity/Trunk Assessment   Upper Extremity Assessment Upper Extremity Assessment: Overall WFL for tasks assessed    Lower Extremity Assessment Lower Extremity Assessment: Generalized weakness (B LE grossly 4/5)       Communication   Communication: HOH  Cognition Arousal/Alertness: Awake/alert Behavior During Therapy: WFL for tasks assessed/performed Overall Cognitive Status: Within Functional Limits for tasks assessed  General Comments      Exercises     Assessment/Plan    PT Assessment Patient needs continued PT services  PT Problem List Decreased strength;Decreased balance;Decreased mobility       PT Treatment Interventions Gait training;DME instruction;Therapeutic exercise    PT Goals (Current goals can be found in the Care Plan section)  Acute Rehab PT Goals Patient Stated Goal: to go home PT Goal Formulation: With patient Time  For Goal Achievement: 03/26/16 Potential to Achieve Goals: Good    Frequency Min 2X/week   Barriers to discharge        Co-evaluation               End of Session Equipment Utilized During Treatment: Gait belt Activity Tolerance: Patient tolerated treatment well Patient left: in chair;with chair alarm set;with family/visitor present Nurse Communication: Mobility status PT Visit Diagnosis: Unsteadiness on feet (R26.81);Muscle weakness (generalized) (M62.81)         Time: [id number] PT Time Calculation (min) (ACUTE ONLY): 13 min   Charges:   PT Evaluation $PT Eval Low Complexity: 1 Procedure     PT G Codes:         Jolynn Bajorek 03/12/2016, 10:11 AM Elizabeth PalauStephanie Loni Delbridge, PT, DPT [phone]

## 2016-03-12 NOTE — Care Management (Signed)
Patient to discharge home today with resumption of home health orders.  Orders placed for RN and PT.  Vickie from [facility] notified of discharge.  Jason with [facility] to deliver nebulizer prior to discharge.  Patient has been weans to RA.  RNCM signing off.

## 2016-03-12 NOTE — Consult Note (Signed)
WOC Nurse wound consult note Reason for Consult: Nonhealing venous stasis wounds to right lower extremities.  Per patient's granddaughter, patient has had doppler studies and wounds are not arterial.  Legs normally have edema, but patient has been diuresed and is supine x 4 days and legs are not edematous.  GRanddaughter states HH Was going to implement Unnas boots after doppler studies, but patient was admitted to hospital for CHF exacerbation.  Patient with history of cellulitis to right leg and MRSA (09/2015) and will swab nares today.   Patient's grannddaughter is not confident that Doxycycline is effective for patient.  Support provided that once culture is back and reviewed, MD will make certain that we are using an effective antibiotic therapy. Noted that assessment is not consistent with cellulitis, aside from tenderness.  Encouraged patient to ask for tramadol that is ordered for pain.  States that the Tylenol has not been effective.   Patient is on Prednisone at this time, however.   Allergic to sulfa, so will not implement silver antimicrobial dressings.  Will begin topical Bactroban and nonadherent dressings.  Wound type: Venous stasis wounds, trauma in origin Pressure Injury POA: N/A Measurement:4 cm x 1.5 cm x 0.2 cm fibrin slough to wound bed. (right anterior pretibial leg) Right posterior lower leg:  Linear abrasion 0.2 cm x 3 cm x 0.2 cm bleeds easily with wound care, tender to touch.  Right medial lower leg 1 cm x 1 cm  X 0.2 cm fibrin slough to wound bed.  Drainage (amount, consistency, odor) Minimal serosanguinous.  No odor.  Periwound:intact, no erythema, resolved edema due to supine positioning and diuresis.  Baseline is generalized edema per granddaughter.  Dressing procedure/placement/frequency:Cleanse wounds to right lower leg with NS and pat gently dry.  Apply Bactroban daily to wound bed.  Cover with Mepitel silicone contact layer for atraumatic dressing removal. Re apply  Bactroban daily and replace Mepitel every 3 days. Cover with 4x4 gauze and kerlix/tape.   Will not follow at this time.  Please re-consult if needed.  Maple HudsonKaren Jerilyn Gillaspie RN BSN CWON Pager [phone]

## 2016-03-12 NOTE — Progress Notes (Signed)
Patient: Terri Barker / Admit Date: 03/08/2016 / Date of Encounter: 03/12/2016, 10:03 AM   Hospital Problem List     Acute respiratory failure Chronic atrial fibrillation COPD, acute exacerbation with bronchitis  Subjective   Seen by Dr. Allyson Sabal February 28 Nonhealing wounds right shin and calf Notes indicate history of diastolic heart failure, recently moved from [place] secondary to recurrent falls in the assisted care facility  She reports significant improvement in cough and shortness of breath.    Inpatient Medications    Scheduled Meds: . acidophilus  1 capsule Oral Daily  . albuterol  2.5 mg Nebulization Q4H  . apixaban  2.5 mg Oral BID  . atorvastatin  5 mg Oral QHS  . azithromycin  250 mg Oral Daily  . benzonatate  100 mg Oral TID  . busPIRone  7.5 mg Oral BID  . calcium-vitamin D  1 tablet Oral Daily  . diltiazem  360 mg Oral Daily  . doxycycline  100 mg Oral Q12H  . feeding supplement (ENSURE ENLIVE)  237 mL Oral Q24H  . [START ON 03/13/2016] furosemide  20 mg Oral Daily  . guaiFENesin  600 mg Oral BID  . insulin aspart  0-5 Units Subcutaneous QHS  . insulin aspart  0-9 Units Subcutaneous TID WC  . methylPREDNISolone (SOLU-MEDROL) injection  60 mg Intravenous Q24H  . metoprolol tartrate  25 mg Oral BID  . oxybutynin  10 mg Oral q morning - 10a  . potassium chloride SA  20 mEq Oral BID  . QUEtiapine  25 mg Oral QHS  . sodium chloride flush  3 mL Intravenous Q12H   Continuous Infusions:  PRN Meds: sodium chloride, acetaminophen **OR** acetaminophen, LORazepam, ondansetron **OR** ondansetron (ZOFRAN) IV, phenol-menthol, sodium chloride flush, traMADol   Objective: Telemetry:  Physical Exam: Blood pressure (!) 157/80, pulse (!) 115, temperature 98.2 F (36.8 C), temperature source Oral, resp. rate 20, height 5\' 3"  (1.6 m), weight 118 lb 1.6 oz (53.6 kg), SpO2 93 %. Body mass index is 20.92 kg/m. GEN: Mild respiratory distress, thin, Coughing HEENT: Grossly  normal.  Neck: Supple, no JVD, carotid bruits, or masses. Cardiac: Irregularly irregular and mildly tachycardic, no murmurs, rubs, or gallops. No clubbing, cyanosis, edema.  Distal pulses are not palpable.  Respiratory:  Very coarse breath sounds bilaterally GI: Soft, nontender, nondistended, BS + x 4. MS: no deformity or atrophy. Skin: warm and dry, no rash. Neuro:  Strength and sensation are intact. Psych: AAOx3.  Normal affect.   Intake/Output Summary (Last 24 hours) at 03/12/16 1003 Last data filed at 03/11/16 2135  Gross per 24 hour  Intake              123 ml  Output              650 ml  Net             -527 ml     Labs: CBC  Recent Labs  03/11/16 0457  WBC 15.4*  HGB 11.5*  HCT 35.3  MCV 81.0  PLT 396   Basic Metabolic Panel  Recent Labs  03/10/16 0538 03/11/16 0457 03/11/16 1717 03/12/16 0513  NA 128* 130*  --  127*  K 3.3* 4.0  --  4.5  CL 90* 93*  --  93*  CO2 30 28  --  27  GLUCOSE 117* 181* 489* 210*  BUN 12 12  --  14  CREATININE 0.36* 0.44  --  0.46  CALCIUM 8.5*  8.9  --  9.0  MG 1.4*  --   --   --    Liver Function Tests No results for input(s): AST, ALT, ALKPHOS, BILITOT, PROT, ALBUMIN in the last 72 hours. No results for input(s): LIPASE, AMYLASE in the last 72 hours. Cardiac Enzymes No results for input(s): CKTOTAL, CKMB, CKMBINDEX, TROPONINI in the last 72 hours. BNP Invalid input(s): POCBNP D-Dimer No results for input(s): DDIMER in the last 72 hours. Hemoglobin A1C No results for input(s): HGBA1C in the last 72 hours. Fasting Lipid Panel No results for input(s): CHOL, HDL, LDLCALC, TRIG, CHOLHDL, LDLDIRECT in the last 72 hours. Thyroid Function Tests No results for input(s): TSH, T4TOTAL, T3FREE, THYROIDAB in the last 72 hours.  Invalid input(s): FREET3  Weights: Filed Weights   03/10/16 0410 03/11/16 0420 03/12/16 0440  Weight: 116 lb 9.6 oz (52.9 kg) 117 lb 3.2 oz (53.2 kg) 118 lb 1.6 oz (53.6 kg)     Radiology/Studies:   Dg Chest 2 View  Result Date: 03/12/2016 CLINICAL DATA:  Follow-up CHF. History of atrial fibrillation, COPD. Persistent cough despite recent antibiotic therapy. Wheezing and shortness of breath. Former smoker. EXAM: CHEST  2 VIEW COMPARISON:  Portable chest x-ray of March 08, 2016 FINDINGS: The lungs are adequately inflated. The interstitial markings are coarse but not greatly changed. There is left lower increased density in the left lower lung with partial obscuration of the hemidiaphragm. There is no significant pleural effusion. The cardiac silhouette is enlarged. The central pulmonary vascularity is prominent. The right infrahilar region remains prominent as well. There is dense calcification in the wall of the thoracic aorta. There is multilevel degenerative disc disease of the thoracic spine with mild anterior wedging of T10. IMPRESSION: COPD. CHF with mild pulmonary interstitial edema. No significant pleural effusion. Right hilar prominence, stable since the earlier study. This may be vascular but could obscure an underlying mass. Given the patient's persistent cough, chest CT scanning would be useful. Thoracic aortic atherosclerosis. Electronically Signed   By: David  SwazilandJordan M.D.   On: 03/12/2016 07:58   Dg Chest Port 1 View  Result Date: 03/08/2016 CLINICAL DATA:  Hypertension, diabetes and CHF. EXAM: PORTABLE CHEST 1 VIEW COMPARISON:  None. FINDINGS: The heart is enlarged. There is moderate tortuosity and dense calcification of the thoracic aorta. Small bilateral pleural effusions and bibasilar infiltrates or atelectasis. No pulmonary edema or pneumothorax. The bony thorax is intact. Calcifications are noted around both shoulder joints. IMPRESSION: Cardiac enlargement. Small effusions, left greater than right and bibasilar infiltrates or atelectasis. Electronically Signed   By: Rudie MeyerP.  Gallerani M.D.   On: 03/08/2016 15:25     Assessment and Plan  83 y.o. female  #1 acute on chronic . Respiratory  failure with hypoxia due to COPD exacerbation Improving with antibiotics and nebulized breathing treatments. She does not appear to be volume overloaded. I decreased the dose of furosemide back to 20 mg once daily.  #2 . COPD exacerbation due toacute bronchitis  antibiotics and inhalers, steroids intravenously, continue oxygen support as needed.  #3 chronic atrial fibrillation-with rapid ventricular response.  Likely triggered by her respiratory status. -Continue oral Cardizem and small dose metoprolol. - She has been on anticoagulation in the past but it appears that it was discontinued for recurrent falls. However, that's not a contraindication for anticoagulation and I believe the benefits outweighed the risks. Thus, I resumed Eliquis 2.5 mg twice daily. I discontinued aspirin.   Signed, Lorine BearsMuhammad Leydy Worthey, MD [facility] 03/12/2016, 10:03 AM

## 2016-03-12 NOTE — Discharge Summary (Signed)
[facility] [place] at [facility]   PATIENT NAME: Terri Barker    MR#:  [id number]  DATE OF BIRTH:  03/05/1927  DATE OF ADMISSION:  03/08/2016 ADMITTING PHYSICIAN: Katharina Caperima Christien Berthelot, MD  DATE OF DISCHARGE: No discharge date for patient encounter.  PRIMARY CARE PHYSICIAN: Marletta LorBarr, Julie, NP     ADMISSION DIAGNOSIS:  Hyponatremia [E87.1] NSTEMI (non-ST elevated myocardial infarction) (HCC) [I21.4] Atrial fibrillation with RVR (HCC) [I48.91]  DISCHARGE DIAGNOSIS:  Principal Problem:   Acute on chronic respiratory failure with hypoxia (HCC) Active Problems:   COPD exacerbation (HCC)   Hypoxia   Acute on chronic combined systolic and diastolic CHF (congestive heart failure) (HCC)   Atrial fibrillation with RVR (HCC)   Bronchitis   Hyponatremia   Hypokalemia   Generalized weakness   SECONDARY DIAGNOSIS:   Past Medical History:  Diagnosis Date  . Chronic atrial fibrillation (HCC)    a. CHA2DS2VASc = 6-->prev on eliquis but d/c'd 02/2016 in the setting of falls.  . Chronic diastolic CHF (congestive heart failure) (HCC)    a. reportedly nl EF on echo in the past.  . Diabetes mellitus without complication (HCC)   . Hypertension   . Mitral valve prolapse   . Osteoporosis   . Recurrent UTI   . Spinal stenosis     .pro HOSPITAL COURSE:   The patient is 83 year old Caucasian female with medical history significant for history of chronic atrial fibrillation, chronic diastolic CHF, diabetes, hypertension, mitral valve prolapse, recurrent UTIs, spinal stenosis, who presents to the hospital with complaints of worsening cough, shortness of breath, lower extremity swelling, going on for the past 3 days. On arrival to the hospital patient was noted to be tachycardic with heart rate around 120. Chest x-ray revealed bibasilar atelectasis versus infiltrates, small pleural effusions, cardiomegaly. Patient was admitted to the hospital with diagnosis of acute diastolic CHF,  started on diuretics. She diuresed about 7.4 L during her stay in the hospital time, however, continued to have some cough and shortness of breath, wheezing. She was felt to have COPD exacerbation due to pneumonia, she was initiated on steroids, nebulizing therapy, antibiotics, her condition clinically improved, as time progressed, however, she continued to have some cough. Repeat the chest x-ray revealed right hilar prominence, pulmonary interstitial edema, COPD, CT scan of the chest was recommended to rule out ligaments/mass. She was seen by physical therapist and recommended home health services. Discussion by problem:  #1 acute on chronic respiratory failure with hypoxia due to COPD exacerbation and diastolic congestive heart failure exacerbation, echocardiogram revealed moderate LVH, moderate MR, calcified mitral apparatus, right ventricular dysfunction. Left pleural effusion. Continue Lasix by mouth, the patient diuresed about 7.4 L since admission . -Appreciate cardiology consult. Continue nebulizing therapy, steroid taper, Doxycycline to complete antibiotic course. Patient is to undergo a CT scan of the chest to rule out any changes.  #2 . COPD exacerbation due to acute bronchitis-continue cough medicines and antibiotics and inhalers , steroid taper, improved clinically,, now off oxygen therapy, O2 sats  are good at 95% on room air at rest, getting CT angiogram of the chest to rule out pulmonary embolism or any other abnormalities. Get O2 sats on room air on exertion prior to discharge from the hospital  #3 chronic atrial fibrillation-with rapid ventricular response. Likely triggered by her respiratory status. -Continue oral Cardizem. Also on oral metoprolol. -, resumed low-dose of Eliquis  by cardiologist  despite  history of falls, since it was felt that benefits  outweighed the risks.   #4. Hyponatremia, initially thought due to fluid overload, initially improved,, but then worsened again,  follow sodium level as outpatient closely  #5 hypokalemia-replaced, magnesium level was found to be low, supplemented intravenously   #6 depression and anxiety-continue Seroquel and when necessary Ativan  #7 DVT prophylaxis-eliquis recommended to be decided by cardiologist  8. Generalized weakness, Physical therapist recommended home health services  DISCHARGE CONDITIONS:   Stable  CONSULTS OBTAINED:  Treatment Team:  Antonieta Iba, MD Iran Ouch, MD  DRUG ALLERGIES:   Allergies  Allergen Reactions  . Codeine   . Feldene [Piroxicam]   . Sporanox [Itraconazole]   . Sulfa Antibiotics     DISCHARGE MEDICATIONS:   Current Discharge Medication List    START taking these medications   Details  albuterol (PROVENTIL) (2.5 MG/3ML) 0.083% nebulizer solution Take 3 mLs (2.5 mg total) by nebulization every 4 (four) hours. Qty: 75 mL, Refills: 12    apixaban (ELIQUIS) 2.5 MG TABS tablet Take 1 tablet (2.5 mg total) by mouth 2 (two) times daily. Qty: 60 tablet, Refills: 6    benzonatate (TESSALON) 100 MG capsule Take 1 capsule (100 mg total) by mouth 3 (three) times daily. Qty: 20 capsule, Refills: 0    doxycycline (VIBRA-TABS) 100 MG tablet Take 1 tablet (100 mg total) by mouth every 12 (twelve) hours. Qty: 14 tablet, Refills: 0    feeding supplement, ENSURE ENLIVE, (ENSURE ENLIVE) LIQD Take 237 mLs by mouth daily. Qty: 237 mL, Refills: 12    guaiFENesin (MUCINEX) 600 MG 12 hr tablet Take 1 tablet (600 mg total) by mouth 2 (two) times daily. Qty: 20 tablet, Refills: 0    metoprolol tartrate (LOPRESSOR) 25 MG tablet Take 1 tablet (25 mg total) by mouth 2 (two) times daily. Qty: 60 tablet, Refills: 6    mupirocin cream (BACTROBAN) 2 % Apply topically daily. Qty: 15 g, Refills: 0    phenol-menthol (CEPASTAT) 14.5 MG lozenge Place 1 lozenge inside cheek as needed for sore throat. Qty: 100 tablet, Refills: 0    predniSONE (DELTASONE) 10 MG tablet Take 1  tablet (10 mg total) by mouth daily with breakfast. Please take 6 pills in the morning on the day 1 and 2, then taper by one pill every 2 days until finished, thank you Qty: 42 tablet, Refills: 0      CONTINUE these medications which have NOT CHANGED   Details  acetaminophen (TYLENOL 8 HOUR) 650 MG CR tablet Take 650 mg by mouth every 8 (eight) hours as needed for pain.    aspirin 81 MG chewable tablet Chew 81 mg by mouth daily.    atorvastatin (LIPITOR) 10 MG tablet Take 5 mg by mouth at bedtime.     B Complex-C-E-Zn (B COMPLEX-C-E-ZINC) tablet Take 1 tablet by mouth daily.    busPIRone (BUSPAR) 7.5 MG tablet Take 7.5 mg by mouth 2 (two) times daily.    Calcium-Vitamins C & D (CALCIUM/C/D PO) Take 1 tablet by mouth daily.    Cholecalciferol (VITAMIN D3) 1000 units CAPS Take 1,000 Units by mouth daily.    Coenzyme Q10 (CO Q 10) 100 MG CAPS Take 100 mg by mouth at bedtime.    CRANBERRY PO Take 1 tablet by mouth daily.    diltiazem (TIAZAC) 360 MG 24 hr capsule Take 360 mg by mouth daily.    furosemide (LASIX) 20 MG tablet Take 1 tablet (20 mg total) by mouth daily. Qty: 90 tablet, Refills: 3  Associated Diagnoses: Ulcers of both lower extremities, unspecified ulcer stage (HCC)    glimepiride (AMARYL) 1 MG tablet Take 1 mg by mouth 2 (two) times daily.    LORazepam (ATIVAN) 0.5 MG tablet Take 0.25 mg by mouth every 6 (six) hours as needed for anxiety.     Multiple Vitamin (MULTIVITAMIN) tablet Take 1 tablet by mouth daily.    omega-3 acid ethyl esters (LOVAZA) 1 g capsule Take 1 g by mouth daily.    oxybutynin (DITROPAN-XL) 10 MG 24 hr tablet Take 10 mg by mouth every morning.    potassium chloride SA (K-DUR,KLOR-CON) 20 MEQ tablet Take 20 mEq by mouth daily.    Probiotic Product (PROBIOTIC DAILY PO) Take 1 tablet by mouth daily.    QUEtiapine (SEROQUEL) 25 MG tablet Take 25 mg by mouth at bedtime.    traMADol (ULTRAM) 50 MG tablet Take 50 mg by mouth every 6 (six) hours  as needed.    alendronate (FOSAMAX) 70 MG tablet Take 70 mg by mouth daily. Take with a full glass of water on an empty stomach.      STOP taking these medications     enalapril (VASOTEC) 10 MG tablet          DISCHARGE INSTRUCTIONS:    The patient is to follow-up with primary care physician as outpatient  If you experience worsening of your admission symptoms, develop shortness of breath, life threatening emergency, suicidal or homicidal thoughts you must seek medical attention immediately by calling 911 or calling your MD immediately  if symptoms less severe.  You Must read complete instructions/literature along with all the possible adverse reactions/side effects for all the Medicines you take and that have been prescribed to you. Take any new Medicines after you have completely understood and accept all the possible adverse reactions/side effects.   Please note  You were cared for by a hospitalist during your hospital stay. If you have any questions about your discharge medications or the care you received while you were in the hospital after you are discharged, you can call the unit and asked to speak with the hospitalist on call if the hospitalist that took care of you is not available. Once you are discharged, your primary care physician will handle any further medical issues. Please note that NO REFILLS for any discharge medications will be authorized once you are discharged, as it is imperative that you return to your primary care physician (or establish a relationship with a primary care physician if you do not have one) for your aftercare needs so that they can reassess your need for medications and monitor your lab values.    Today   CHIEF COMPLAINT:   Chief Complaint  Patient presents with  . Abnormal Labs    HISTORY OF PRESENT ILLNESS:  Terri Barker  is a 83 y.o. female with a known history of chronic atrial fibrillation, chronic diastolic CHF, diabetes,  hypertension, mitral valve prolapse, recurrent UTIs, spinal stenosis, who presents to the hospital with complaints of worsening cough, shortness of breath, lower extremity swelling, going on for the past 3 days. On arrival to the hospital patient was noted to be tachycardic with heart rate around 120. Chest x-ray revealed bibasilar atelectasis versus infiltrates, small pleural effusions, cardiomegaly. Patient was admitted to the hospital with diagnosis of acute diastolic CHF, started on diuretics. She diuresed about 7.4 L during her stay in the hospital time, however, continued to have some cough and shortness of breath, wheezing. She was  felt to have COPD exacerbation due to pneumonia, she was initiated on steroids, nebulizing therapy, antibiotics, her condition clinically improved, as time progressed, however, she continued to have some cough. Repeat the chest x-ray revealed right hilar prominence, pulmonary interstitial edema, COPD, CT scan of the chest was recommended to rule out ligaments/mass. She was seen by physical therapist and recommended home health services. Discussion by problem:  #1 acute on chronic respiratory failure with hypoxia due to COPD exacerbation and diastolic congestive heart failure exacerbation, echocardiogram revealed moderate LVH, moderate MR, calcified mitral apparatus, right ventricular dysfunction. Left pleural effusion. Continue Lasix by mouth, the patient diuresed about 7.4 L since admission . -Appreciate cardiology consult. Continue nebulizing therapy, steroid taper, Doxycycline to complete antibiotic course. Patient is to undergo a CT scan of the chest to rule out any changes.  #2 . COPD exacerbation due to acute bronchitis-continue cough medicines and antibiotics and inhalers , steroid taper, improved clinically,, now off oxygen therapy, O2 sats  are good at 95% on room air at rest, getting CT angiogram of the chest to rule out pulmonary embolism or any other  abnormalities. Get O2 sats on room air on exertion prior to discharge from the hospital  #3 chronic atrial fibrillation-with rapid ventricular response. Likely triggered by her respiratory status. -Continue oral Cardizem. Also on oral metoprolol. -, resumed low-dose of Eliquis  by cardiologist  despite  history of falls, since it was felt that benefits outweighed the risks.   #4. Hyponatremia, initially thought due to fluid overload, initially improved,, but then worsened again, follow sodium level as outpatient closely  #5 hypokalemia-replaced, magnesium level was found to be low, supplemented intravenously   #6 depression and anxiety-continue Seroquel and when necessary Ativan  #7 DVT prophylaxis-eliquis recommended to be decided by cardiologist  8. Generalized weakness, Physical therapist recommended home health services     VITAL SIGNS:  Blood pressure (!) 129/51, pulse 86, temperature 97.6 F (36.4 C), temperature source Oral, resp. rate 16, height 5\' 3"  (1.6 m), weight 53.6 kg (118 lb 1.6 oz), SpO2 95 %.  I/O:   Intake/Output Summary (Last 24 hours) at 03/12/16 1330 Last data filed at 03/11/16 2135  Gross per 24 hour  Intake                3 ml  Output              650 ml  Net             -647 ml    PHYSICAL EXAMINATION:  GENERAL:  83 y.o.-year-old patient lying in the bed with no acute distress.  EYES: Pupils equal, round, reactive to light and accommodation. No scleral icterus. Extraocular muscles intact.  HEENT: Head atraumatic, normocephalic. Oropharynx and nasopharynx clear.  NECK:  Supple, no jugular venous distention. No thyroid enlargement, no tenderness.  LUNGS: Intermittent cough, somewhat diminished breath sounds bilaterally,. Scattered rhonchi, no wheezing, rales, or crepitation. No use of accessory muscles of respiration.  CARDIOVASCULAR: S1, S2irregular regular. No murmurs, rubs, or gallops.  ABDOMEN: Soft, non-tender, non-distended. Bowel sounds  present. No organomegaly or mass.  EXTREMITIES: No pedal edema, cyanosis, or clubbing.  NEUROLOGIC: Cranial nerves II through XII are intact. Muscle strength 5/5 in all extremities. Sensation intact. Gait not checked.  PSYCHIATRIC: The patient is alert and oriented x 3.  SKIN: No obvious rash, lesion, or ulcer.   DATA REVIEW:   CBC  Recent Labs Lab 03/11/16 0457  WBC 15.4*  HGB 11.5*  HCT 35.3  PLT 396    Chemistries   Recent Labs Lab 03/08/16 1534  03/10/16 0538  03/12/16 0513  NA 123*  < > 128*  < > 127*  K 3.5  < > 3.3*  < > 4.5  CL 90*  < > 90*  < > 93*  CO2 24  < > 30  < > 27  GLUCOSE 107*  < > 117*  < > 210*  BUN 11  < > 12  < > 14  CREATININE 0.40*  < > 0.36*  < > 0.46  CALCIUM 8.8*  < > 8.5*  < > 9.0  MG  --   --  1.4*  --   --   AST 26  --   --   --   --   ALT 20  --   --   --   --   ALKPHOS 155*  --   --   --   --   BILITOT 0.7  --   --   --   --   < > = values in this interval not displayed.  Cardiac Enzymes  Recent Labs Lab 03/08/16 1534  TROPONINI 0.03*    Microbiology Results  Results for orders placed or performed during the hospital encounter of 03/08/16  Urine culture     Status: None   Collection Time: 03/08/16  4:12 PM  Result Value Ref Range Status   Specimen Description URINE, RANDOM  Final   Special Requests NONE  Final   Culture   Final    NO GROWTH Performed at [facility], [address] [place], [place] [address]    Report Status 03/10/2016 FINAL  Final  Culture, blood (routine x 2) Call MD if unable to obtain prior to antibiotics being given     Status: None (Preliminary result)   Collection Time: 03/08/16  9:34 PM  Result Value Ref Range Status   Specimen Description BLOOD  L AC  Final   Special Requests BOTTLES DRAWN AEROBIC AND ANAEROBIC  LOW VOLUME   Final   Culture NO GROWTH 4 DAYS  Final   Report Status PENDING  Incomplete  Culture, blood (routine x 2) Call MD if unable to obtain prior to antibiotics being  given     Status: None (Preliminary result)   Collection Time: 03/08/16  9:34 PM  Result Value Ref Range Status   Specimen Description BLOOD  L HAND  Final   Special Requests   Final    BOTTLES DRAWN AEROBIC AND ANAEROBIC  ADEQUATE VOLUME    Culture NO GROWTH 4 DAYS  Final   Report Status PENDING  Incomplete  Aerobic Culture (superficial specimen)     Status: None (Preliminary result)   Collection Time: 03/11/16  5:10 PM  Result Value Ref Range Status   Specimen Description WOUND LEG LEFT ANTERIOR  Final   Special Requests Normal  Final   Gram Stain   Final    DEGENERATED CELLULAR MATERIAL PRESENT RARE GRAM VARIABLE ROD Performed at [facility], [address], [place], [place] [address]    Culture PENDING  Incomplete   Report Status PENDING  Incomplete  Aerobic Culture (superficial specimen)     Status: None (Preliminary result)   Collection Time: 03/11/16  5:10 PM  Result Value Ref Range Status   Specimen Description WOUND LEG LEFT POSTERIOR  Final   Special Requests NONE  Final   Gram Stain  Final    NO WBC SEEN NO ORGANISMS SEEN Performed at [facility], [address], [place], [place] [address]    Culture PENDING  Incomplete   Report Status PENDING  Incomplete  MRSA PCR Screening     Status: None   Collection Time: 03/12/16 10:20 AM  Result Value Ref Range Status   MRSA by PCR NEGATIVE NEGATIVE Final    Comment:        The GeneXpert MRSA Assay (FDA approved for NASAL specimens only), is one component of a comprehensive MRSA colonization surveillance program. It is not intended to diagnose MRSA infection nor to guide or monitor treatment for MRSA infections.     RADIOLOGY:  Dg Chest 2 View  Result Date: 03/12/2016 CLINICAL DATA:  Follow-up CHF. History of atrial fibrillation, COPD. Persistent cough despite recent antibiotic therapy. Wheezing and shortness of breath. Former smoker. EXAM: CHEST  2 VIEW COMPARISON:  Portable chest x-ray of March 08, 2016 FINDINGS: The lungs are adequately inflated. The interstitial markings are coarse but not greatly changed. There is left lower increased density in the left lower lung with partial obscuration of the hemidiaphragm. There is no significant pleural effusion. The cardiac silhouette is enlarged. The central pulmonary vascularity is prominent. The right infrahilar region remains prominent as well. There is dense calcification in the wall of the thoracic aorta. There is multilevel degenerative disc disease of the thoracic spine with mild anterior wedging of T10. IMPRESSION: COPD. CHF with mild pulmonary interstitial edema. No significant pleural effusion. Right hilar prominence, stable since the earlier study. This may be vascular but could obscure an underlying mass. Given the patient's persistent cough, chest CT scanning would be useful. Thoracic aortic atherosclerosis. Electronically Signed   By: David  [place] M.D.   On: 03/12/2016 07:58    EKG:   Orders placed or performed during the hospital encounter of 03/08/16  . ED EKG 12-Lead  . ED EKG  . ED EKG 12-Lead  . ED EKG  . EKG 12-Lead  . EKG 12-Lead      Management plans discussed with the patient, family and they are in agreement.  CODE STATUS:     Code Status Orders        Start     Ordered   03/08/16 2032  Do not attempt resuscitation (DNR)  Continuous    Question Answer Comment  In the event of cardiac or respiratory ARREST Do not call a "code blue"   In the event of cardiac or respiratory ARREST Do not perform Intubation, CPR, defibrillation or ACLS   In the event of cardiac or respiratory ARREST Use medication by any Barker, position, wound care, and other measures to relive pain and suffering. May use oxygen, suction and manual treatment of airway obstruction as needed for comfort.      03/08/16 2031    Code Status History    Date Active Date Inactive Code Status Order ID Comments User Context   This patient has a  current code status but no historical code status.    Advance Directive Documentation   Flowsheet Row Most Recent Value  Type of Advance Directive  Out of facility DNR (pink MOST or yellow form)  Pre-existing out of facility DNR order (yellow form or pink MOST form)  Physician notified to receive inpatient order  "MOST" Form in Place?  No data      TOTAL TIME TAKING CARE OF THIS PATIENT: 40  minutes.    Katharina Caper  M.D on 03/12/2016 at 1:30 PM  Between 7am to 6pm - Pager - [phone]  After 6pm go to [email or web address] - password [email or web address] [facility]  Office  [phone]  CC: Primary care physician; Marletta Lor, NP

## 2016-03-12 NOTE — Care Management Important Message (Signed)
Important Message  Patient Details  Name: Terri Barker MRN: [id number] Date of Birth: Apr 19, 1927   Medicare Important Message Given:  Yes    Chapman FitchBOWEN, [name], RN 03/12/2016, 2:49 PM

## 2016-03-12 NOTE — Progress Notes (Signed)
Granddaughter says that patient had a positive MRSA nares swab in both September 2017 and January 2018.  Mrsa swab sent. Contact isolation initiated.

## 2016-03-13 LAB — CULTURE, BLOOD (ROUTINE X 2)
CULTURE: NO GROWTH
Culture: NO GROWTH
SPECIAL REQUESTS: ADEQUATE

## 2016-03-15 LAB — AEROBIC CULTURE W GRAM STAIN (SUPERFICIAL SPECIMEN): Gram Stain: NONE SEEN

## 2016-03-15 LAB — AEROBIC CULTURE  (SUPERFICIAL SPECIMEN): SPECIAL REQUESTS: NORMAL

## 2016-03-19 ENCOUNTER — Telehealth: Payer: Self-pay | Admitting: Cardiovascular Disease

## 2016-03-19 NOTE — Telephone Encounter (Signed)
ECHOCARDIOGRAM COMPLETE  Order: [id number]  Status:  Final result Visible to patient:  No (Not Released)  Notes Recorded by Evans Lanceaylor W Everet Flagg on 03/19/2016 at 9:49 AM EDT [facility]. ------  Notes Recorded by Runell GessJonathan J Berry, MD on 03/15/2016 at 2:59 PM EST 2D from [place]. Nl LV fxn. Mod LVH. Bi atrial enlargement. Fairly unremarkable echo

## 2016-03-20 ENCOUNTER — Telehealth: Payer: Self-pay | Admitting: Cardiovascular Disease

## 2016-03-20 NOTE — Telephone Encounter (Signed)
New message      Calling to let Dr Allyson SabalBerry know pt is under the care of hospice as of 03-14-16.  All future appts were cancelled.

## 2016-03-21 ENCOUNTER — Ambulatory Visit: Payer: Medicare Other | Admitting: Family

## 2016-04-03 ENCOUNTER — Encounter (HOSPITAL_COMMUNITY): Payer: Medicare Other

## 2016-06-05 ENCOUNTER — Ambulatory Visit: Payer: Medicare Other | Admitting: Cardiovascular Disease

## 2016-06-08 DEATH — deceased

## 2018-04-23 IMAGING — DX DG CHEST 1V PORT
1 series · 1 of 1 positions shown · non-contrast
Comparison: None.

CLINICAL DATA: Hypertension, diabetes and CHF.

EXAM:
PORTABLE CHEST 1 VIEW

[chest ap]
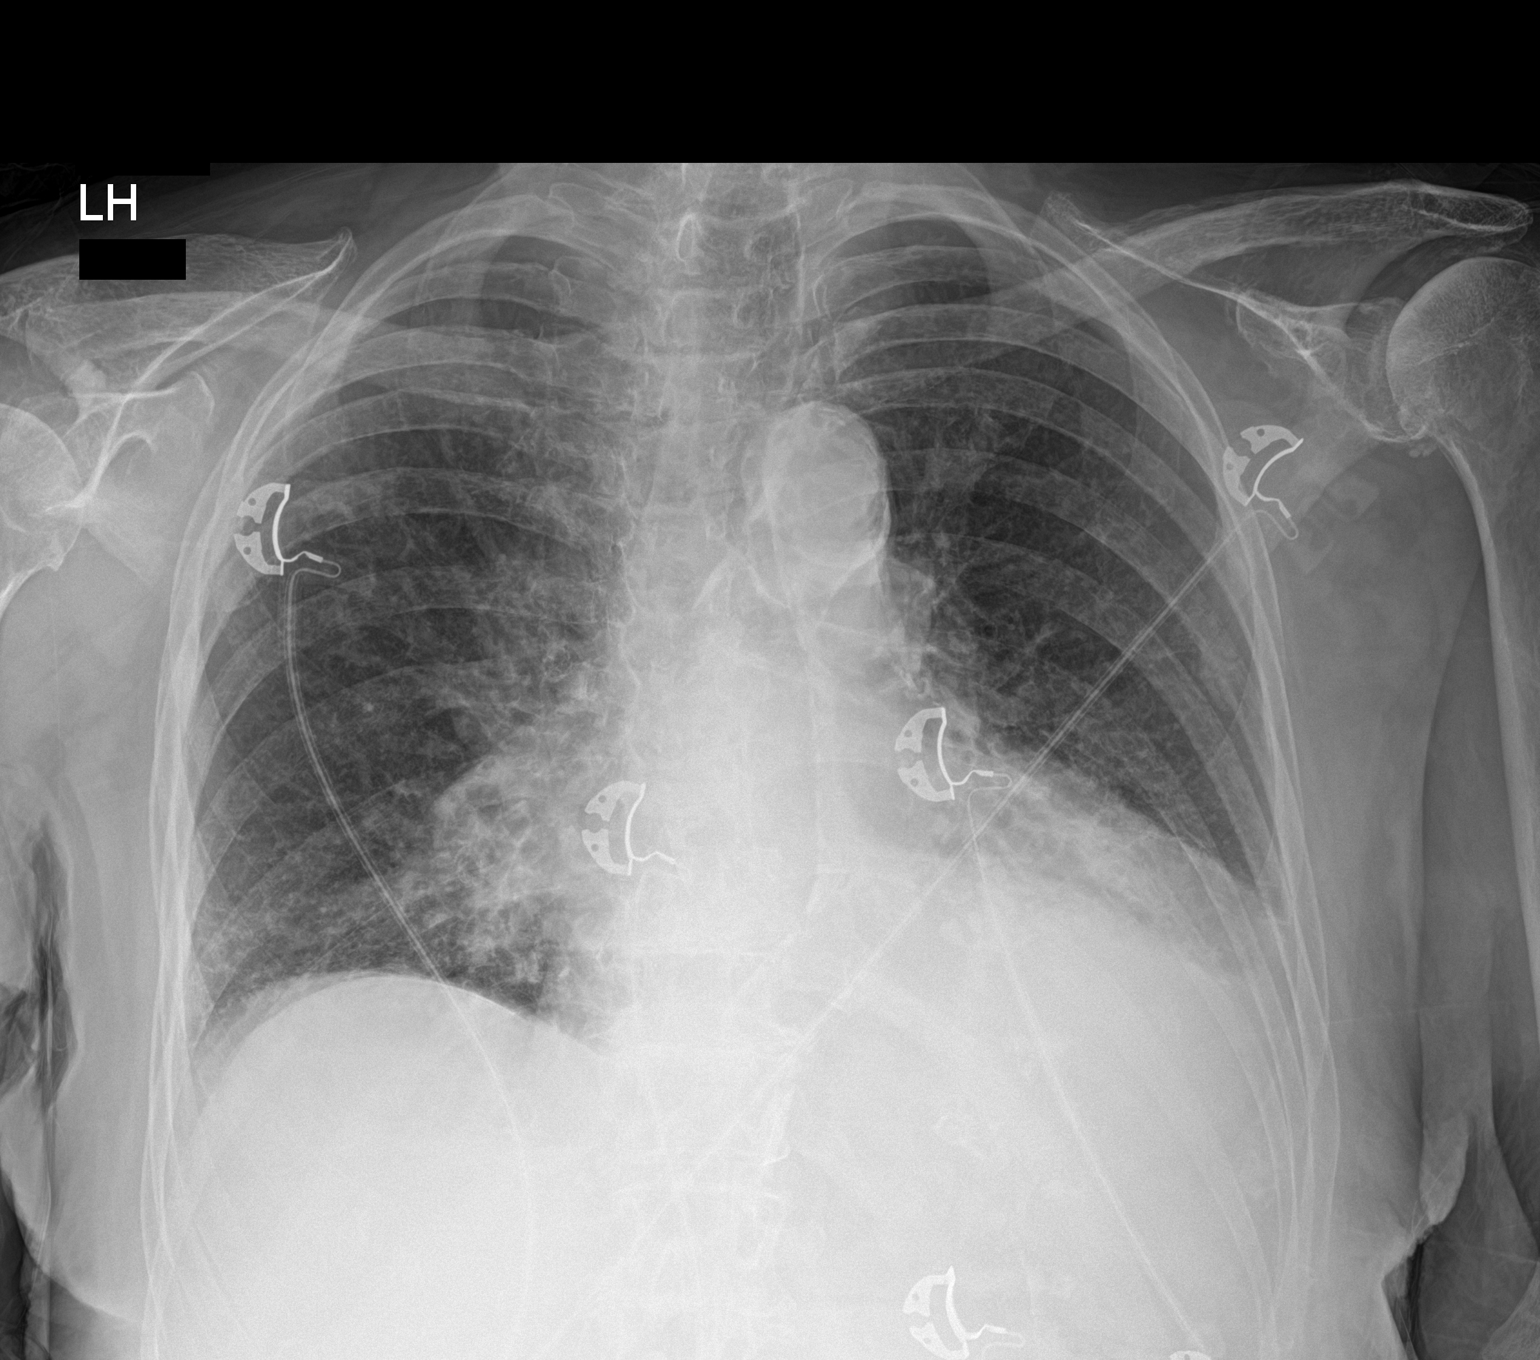

[1 of 1 positions shown; findings below may reference images not displayed]

FINDINGS: The heart is enlarged. There is moderate tortuosity and dense
calcification of the thoracic aorta. Small bilateral pleural
effusions and bibasilar infiltrates or atelectasis. No pulmonary
edema or pneumothorax. The bony thorax is intact. Calcifications are
noted around both shoulder joints.
IMPRESSION: Cardiac enlargement.

Small effusions, left greater than right and bibasilar infiltrates
or atelectasis.

## 2018-04-27 IMAGING — CT CT ANGIO CHEST
2 of 6 series · 19 of 46 positions shown · IV contrast (APPLIED)
Comparison: Chest x-ray dated 03/12/2016

CLINICAL DATA: Progressive cough and shortness of breath. Lower
extremity swelling.

EXAM:
CT ANGIOGRAPHY CHEST WITH CONTRAST
TECHNIQUE: Multidetector CT imaging of the chest was performed using the
standard protocol during bolus administration of intravenous
contrast. Multiplanar CT image reconstructions and MIPs were
obtained to evaluate the vascular anatomy.
CONTRAST:  75 cc Isovue 370

[Series 5: thins · axial · 0.69mm/px · z∈[-259,-6]mm · 17 of 279 slices shown]
[im 13/279  lung]
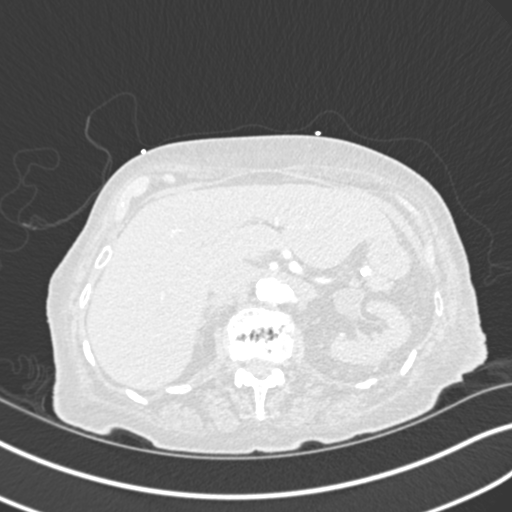
[im 25/279  soft-tissue]
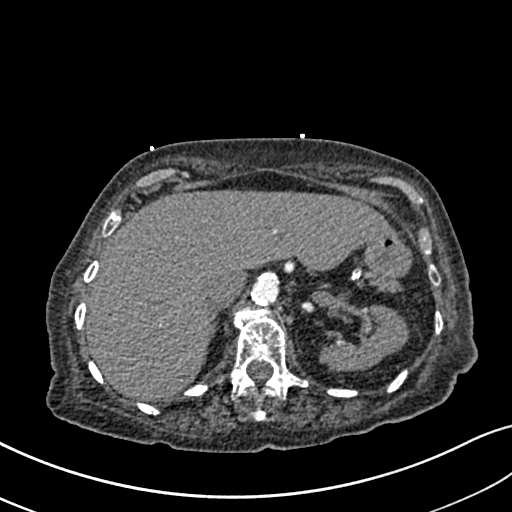
[im 49/279  lung]
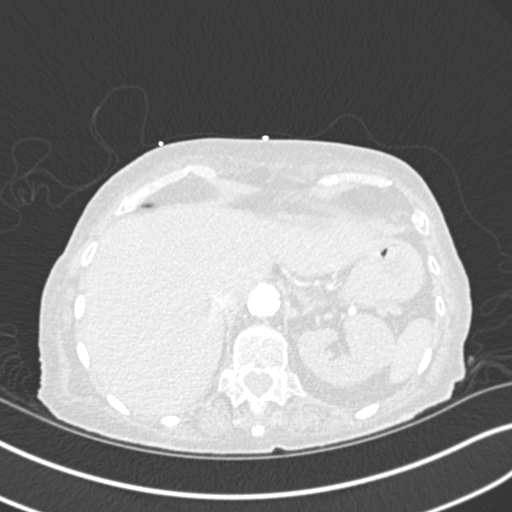
[im 61/279  soft-tissue]
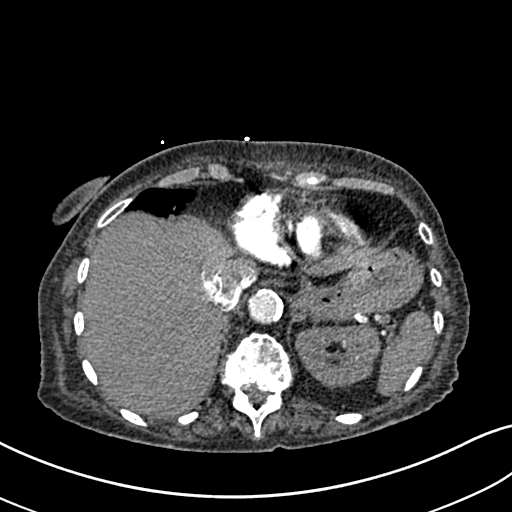
[im 73/279  lung]
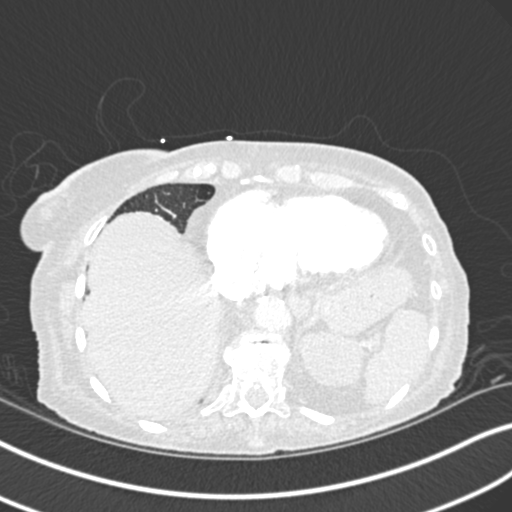
[im 97/279  soft-tissue]
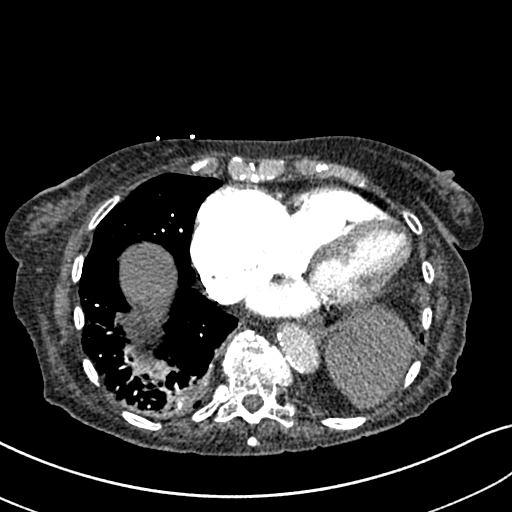
[im 109/279  lung]
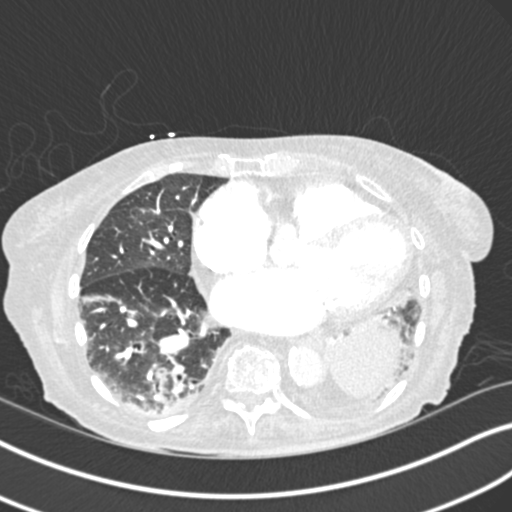
[im 121/279  soft-tissue]
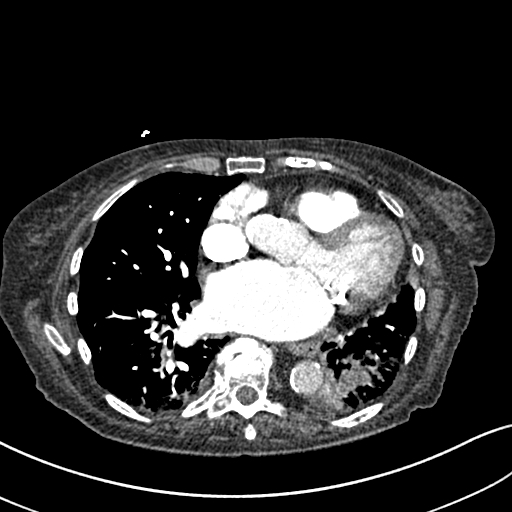
[im 146/279  lung]
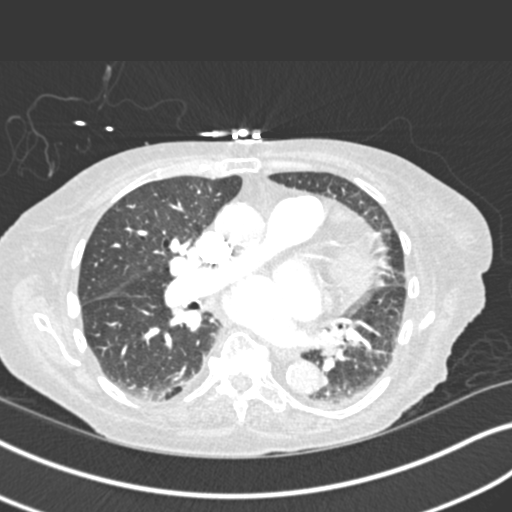
[im 158/279  soft-tissue]
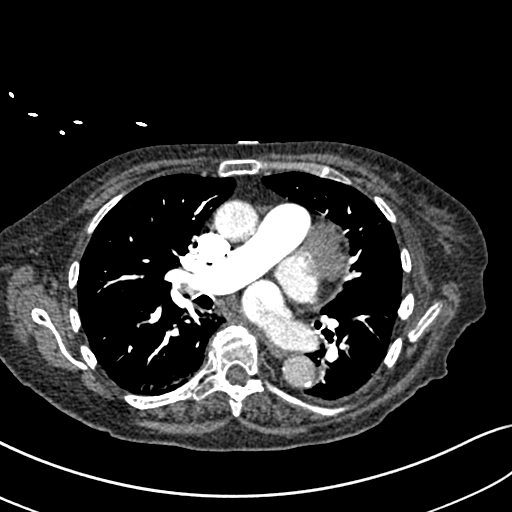
[im 170/279  lung]
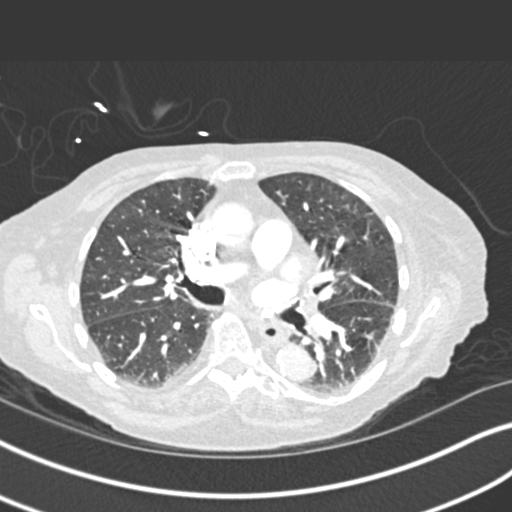
[im 182/279  soft-tissue]
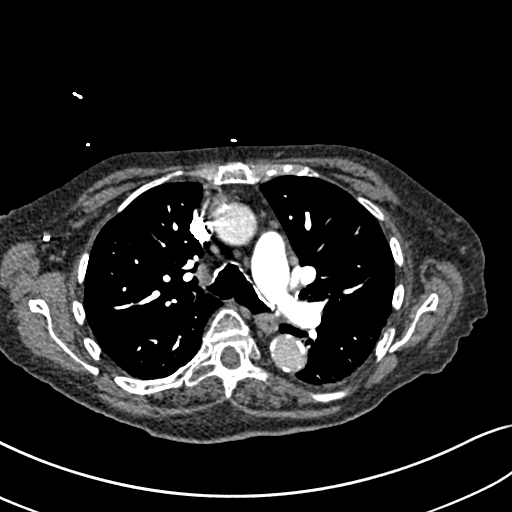
[im 206/279  lung]
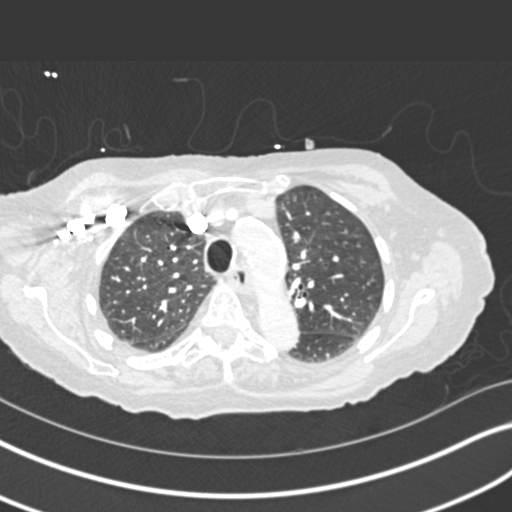
[im 218/279  soft-tissue]
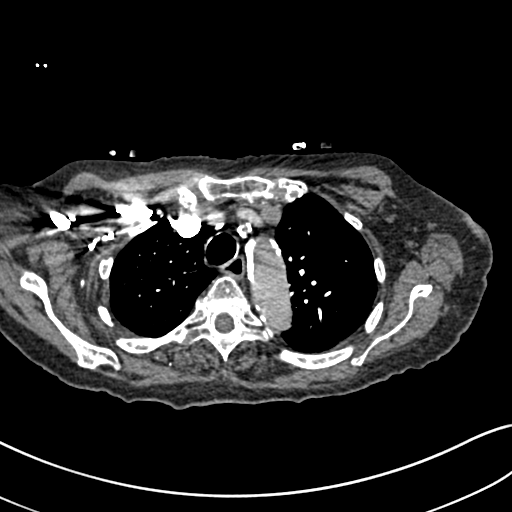
[im 230/279  lung]
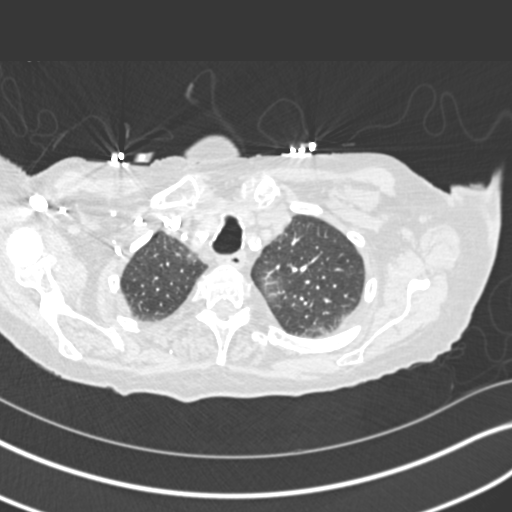
[im 254/279  soft-tissue]
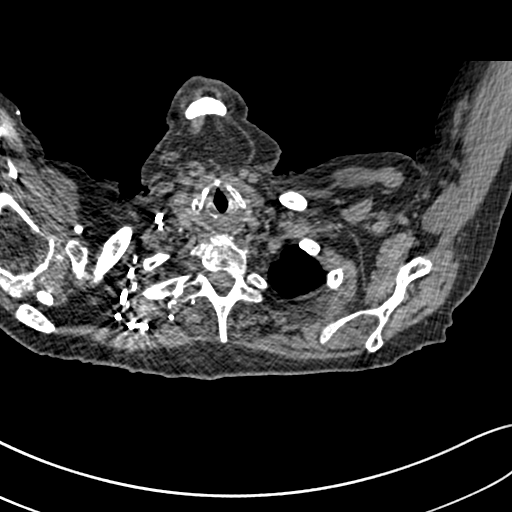
[im 266/279  lung]
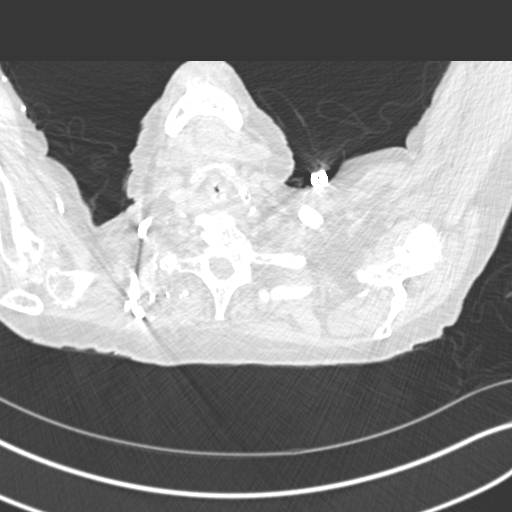

[Series 7: coronal mpr · coronal · 0.58mm/px · 2 of 72 slices shown]
[im 24/72  soft-tissue]
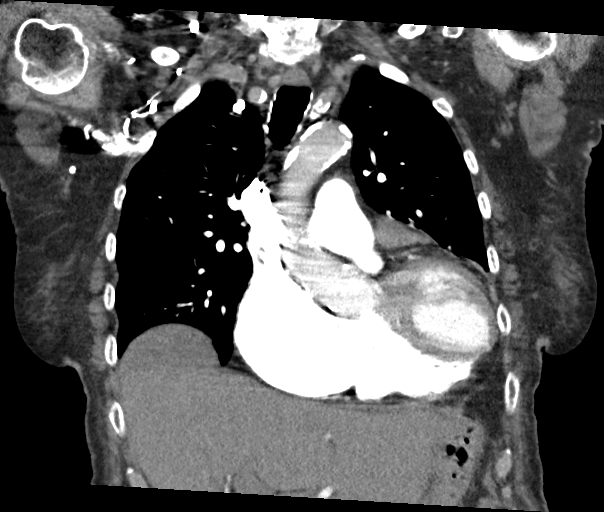
[im 48/72  soft-tissue]
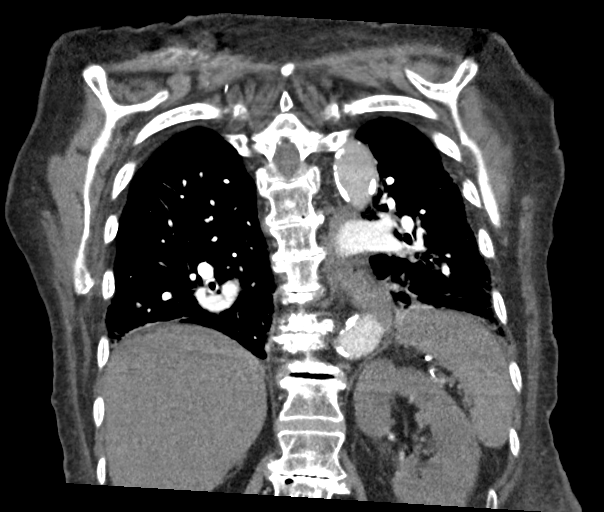

[19 of 46 positions shown; findings below may reference images not displayed]

FINDINGS: Cardiovascular: There are no pulmonary emboli. Extensive aortic
atherosclerosis. Cardiomegaly.

Mediastinum/Nodes: No enlarged mediastinal, hilar, or axillary lymph
nodes. Thyroid gland, trachea, and esophagus demonstrate no
significant findings.

Lungs/Pleura: Minimal atelectasis at the lung bases with a small
bleb posteriorly at the right base.

Upper Abdomen: Slightly prominent left renal pelvis, not felt to be
significant. Otherwise normal.

Musculoskeletal: Compression fracture T10, probably old. Diffuse
degenerative disc disease in the mid lower thoracic spine and at
L1-2.

Review of the MIP images confirms the above findings.
IMPRESSION: 1. Cardiomegaly.
2. No pulmonary emboli.
3. Slight atelectasis at the lung bases.
4. Aortic atherosclerosis.
5. Compression fracture of T10, probably old.
